# Patient Record
Sex: Female | Born: 1985 | Race: Black or African American | Hispanic: No | Marital: Married | Smoking: Never smoker
Health system: Southern US, Community
[De-identification: ages and names within clinical notes are randomized; demographics above are authoritative.]

## PROBLEM LIST (undated history)

## (undated) ENCOUNTER — Inpatient Hospital Stay (HOSPITAL_COMMUNITY): Payer: BLUE CROSS/BLUE SHIELD

## (undated) DIAGNOSIS — M359 Systemic involvement of connective tissue, unspecified: Secondary | ICD-10-CM

## (undated) HISTORY — PX: APPENDECTOMY: SHX54

---

## 2006-10-13 ENCOUNTER — Emergency Department: Payer: Self-pay | Admitting: Emergency Medicine

## 2006-10-27 HISTORY — PX: WISDOM TOOTH EXTRACTION: SHX21

## 2007-07-31 ENCOUNTER — Inpatient Hospital Stay (HOSPITAL_COMMUNITY): Admission: EM | Admit: 2007-07-31 | Discharge: 2007-08-01 | Payer: Self-pay | Admitting: Emergency Medicine

## 2007-07-31 ENCOUNTER — Encounter (INDEPENDENT_AMBULATORY_CARE_PROVIDER_SITE_OTHER): Payer: Self-pay | Admitting: General Surgery

## 2007-11-01 ENCOUNTER — Other Ambulatory Visit: Admission: RE | Admit: 2007-11-01 | Discharge: 2007-11-01 | Payer: Self-pay | Admitting: Family Medicine

## 2008-04-10 IMAGING — CT CT PELVIS W/ CM
2 of 5 series · 17 of 46 positions shown, 19 images · IV contrast (OMNI 300/WATER & 100 ML OMNI 300)
Comparison: There is no prior study for comparison.

CLINICAL DATA: Possible appendicitis. 
ABDOMEN CT WITH CONTRAST:
TECHNIQUE: Multidetector CT imaging of the abdomen was performed following the standard protocol during bolus administration of intravenous contrast.
Contrast:  100 cc Omnipaque 300
TECHNIQUE: Multidetector CT imaging of the pelvis was performed following the standard protocol during bolus administration of intravenous contrast.

[Series 2: routine abdomen · axial · 0.76mm/px · z∈[-388,-23]mm · 14 of 82 slices shown, 16 images]
[im 5/82  soft-tissue]
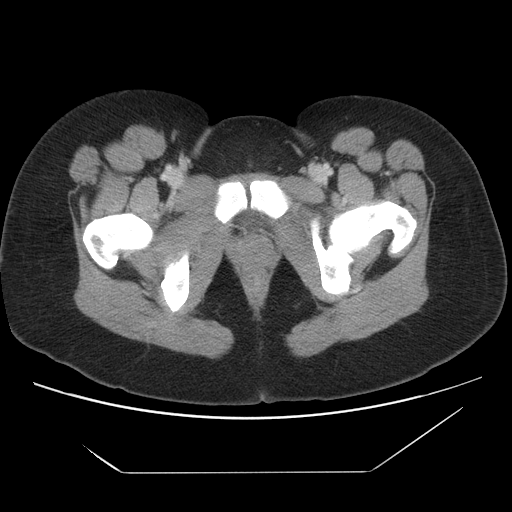
[im 5/82  bone]
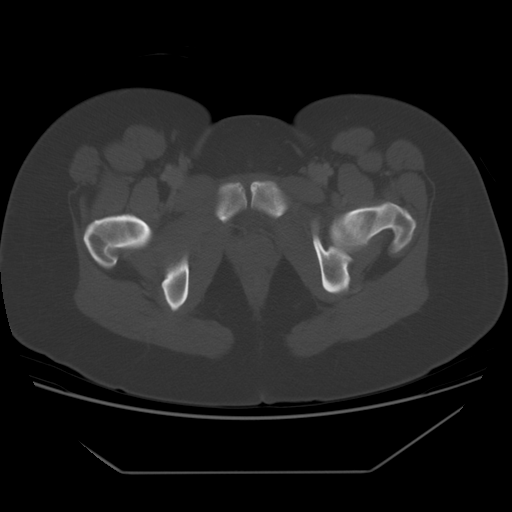
[im 9/82  soft-tissue]
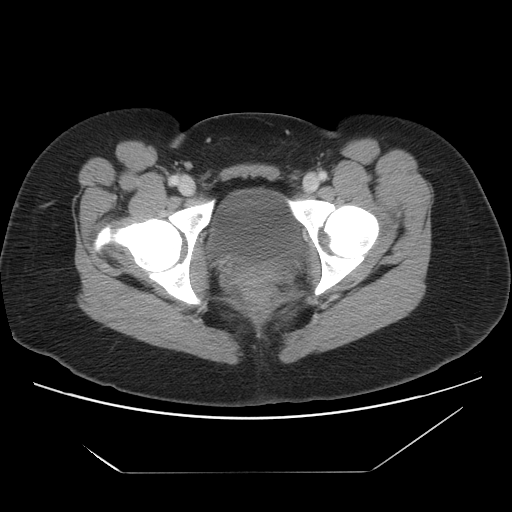
[im 18/82  soft-tissue]
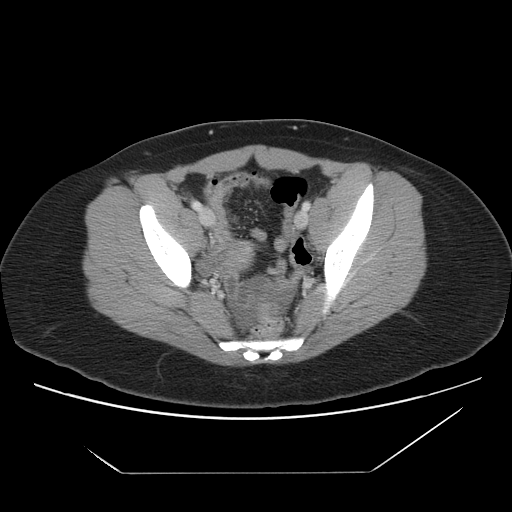
[im 22/82  soft-tissue]
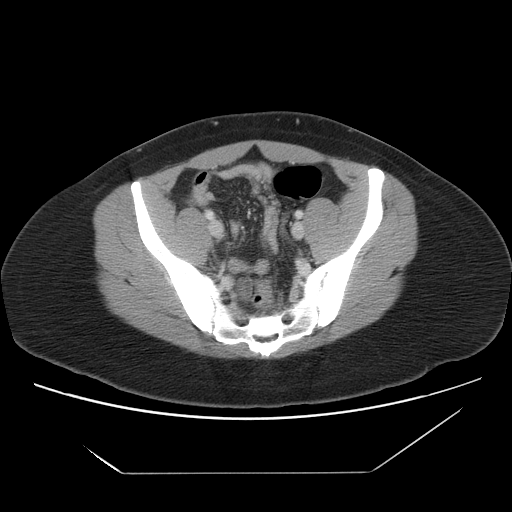
[im 26/82  soft-tissue]
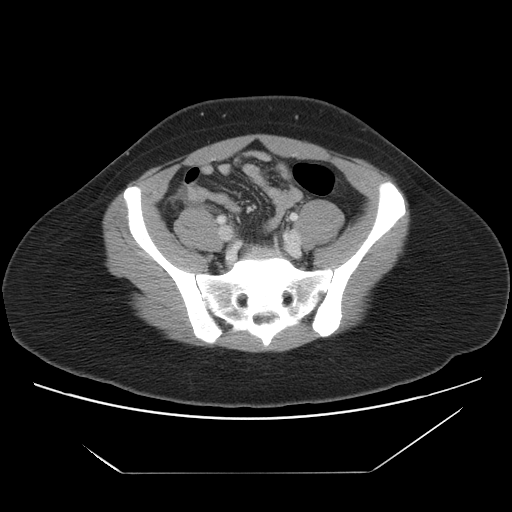
[im 35/82  soft-tissue]
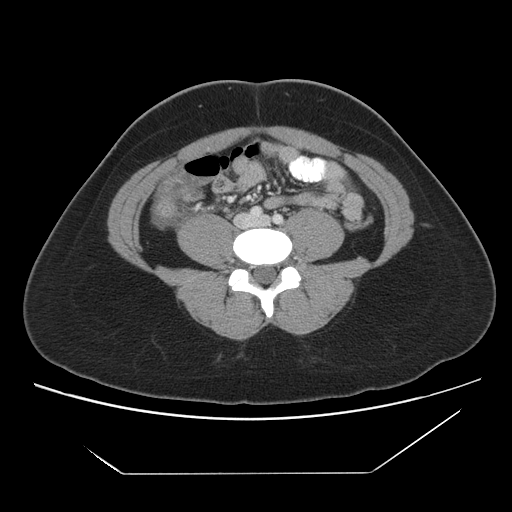
[im 39/82  soft-tissue]
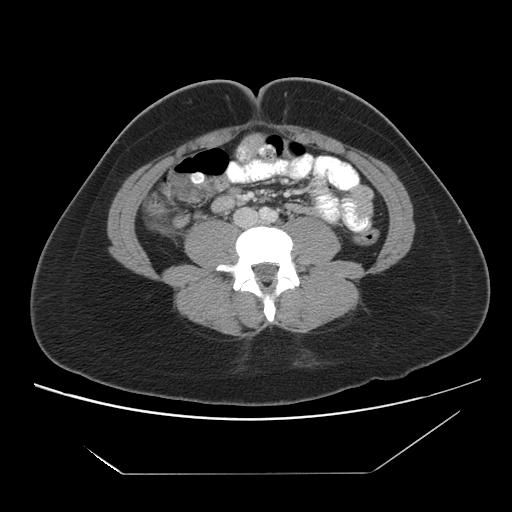
[im 43/82  soft-tissue]
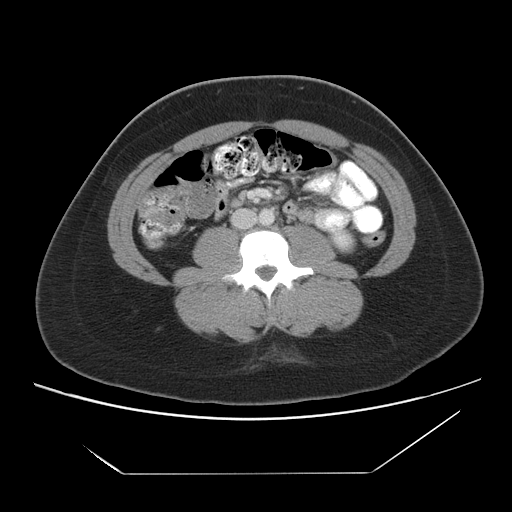
[im 47/82  soft-tissue]
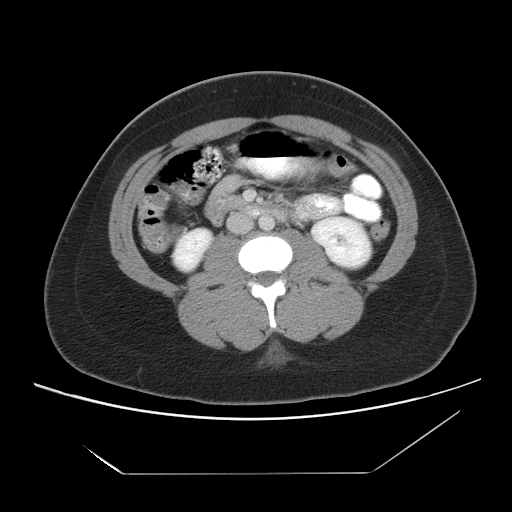
[im 47/82  bone]
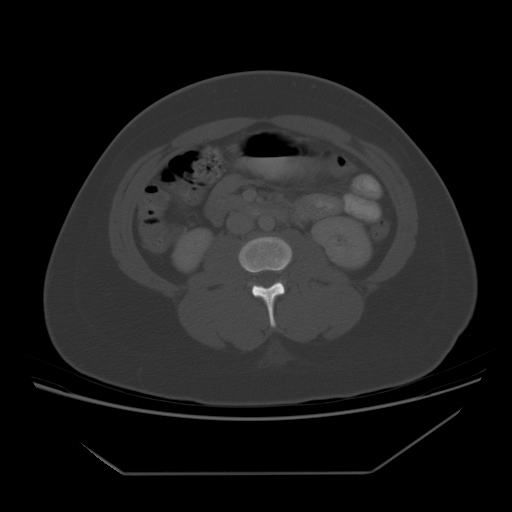
[im 56/82  soft-tissue]
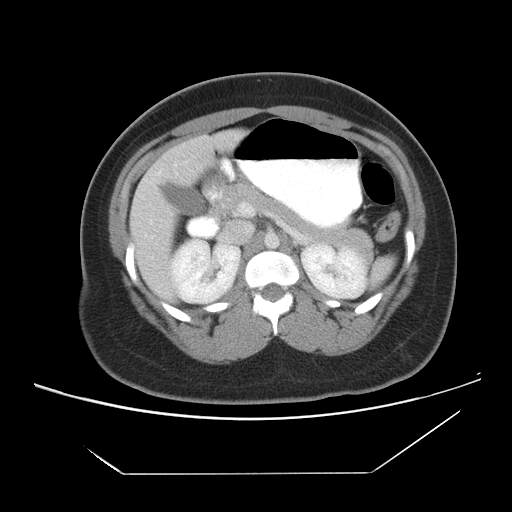
[im 60/82  soft-tissue]
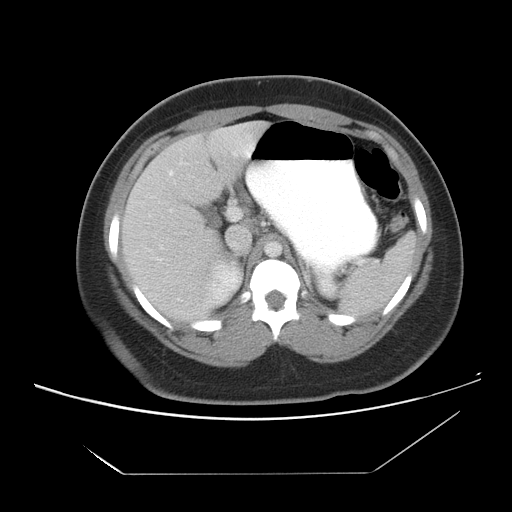
[im 64/82  soft-tissue]
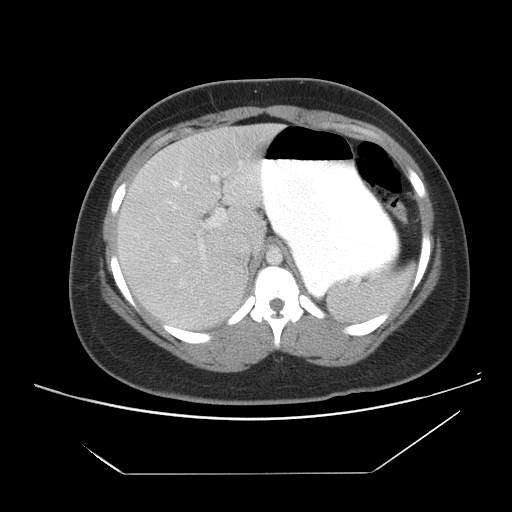
[im 73/82  soft-tissue]
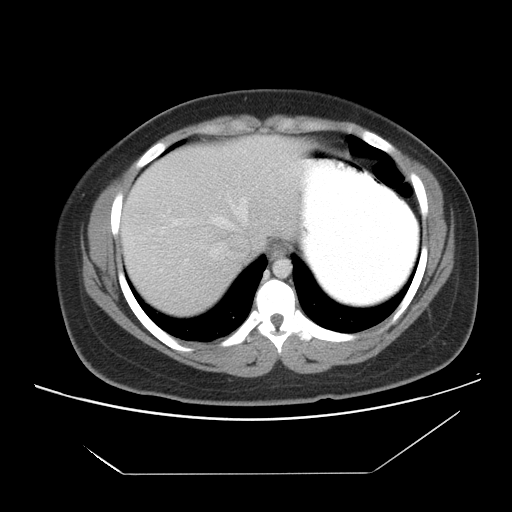
[im 77/82  soft-tissue]
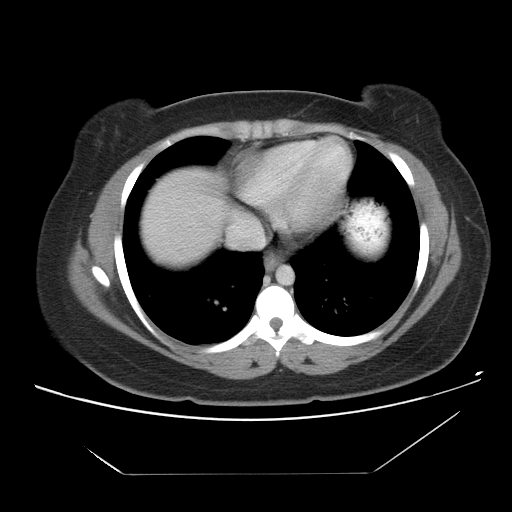

[Series 400: coronals · coronal · 0.88mm/px · 3 of 64 slices shown]
[im 22/64  soft-tissue]
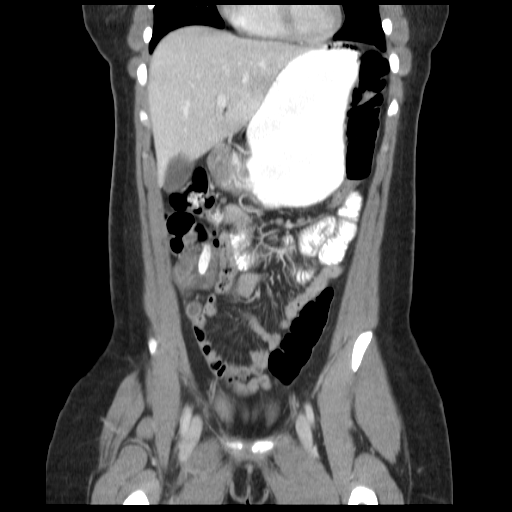
[im 29/64  soft-tissue]
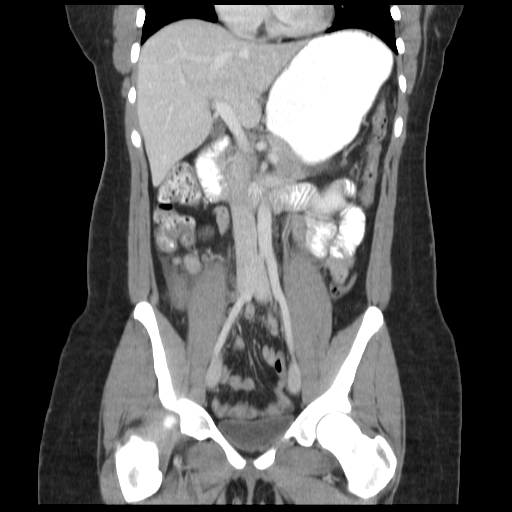
[im 36/64  soft-tissue]
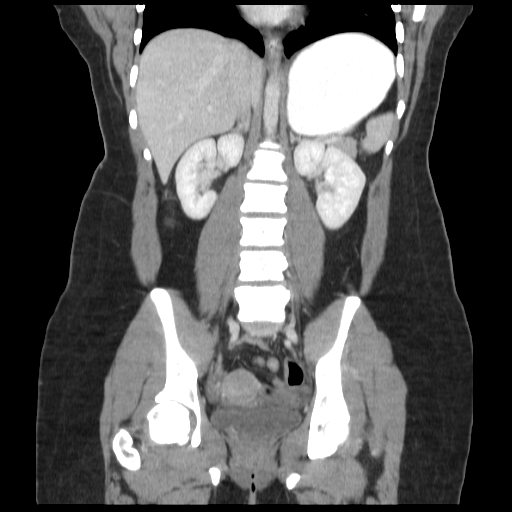

[17 of 46 positions shown; findings below may reference images not displayed]

FINDINGS: The visualized lung bases are unremarkable. 
The enhanced liver, spleen, pancreas, and adrenals are unremarkable.  There is no bowel obstruction.  There is no ascites or free air. No adenopathy is noted. The kidneys show symmetrical size and enhancement without evidence of focal mass. There is no hydronephrosis.
IMPRESSION: 1.  No bowel obstruction.  No ascites or free air.
2.  No adenopathy.
PELVIS CT WITH CONTRAST:
FINDINGS: There is markedly inflamed appendix with marked thickening and enhancement of the wall surrounded by fluid and stranding of surrounding fat.  The appendix measures 1 cm in diameter.  The findings are consistent with acute appendicitis. 
Small amount of free fluid is noted within posterior cul-de-sac. The enhanced uterus and adnexa are unremarkable. The urinary bladder is unremarkable. No destructive bony lesions are seen.
IMPRESSION: 1.  Acute appendicitis. 
2.  Small amount of pelvic free fluid noted.

## 2008-11-22 ENCOUNTER — Other Ambulatory Visit: Admission: RE | Admit: 2008-11-22 | Discharge: 2008-11-22 | Payer: Self-pay | Admitting: Family Medicine

## 2009-11-22 ENCOUNTER — Other Ambulatory Visit: Admission: RE | Admit: 2009-11-22 | Discharge: 2009-11-22 | Payer: Self-pay | Admitting: Family Medicine

## 2010-11-25 ENCOUNTER — Other Ambulatory Visit (HOSPITAL_COMMUNITY)
Admission: RE | Admit: 2010-11-25 | Discharge: 2010-11-25 | Disposition: A | Discharge status: Home / Self Care | Payer: BC Managed Care – PPO | Source: Ambulatory Visit | Attending: Family Medicine | Admitting: Family Medicine

## 2010-11-25 ENCOUNTER — Other Ambulatory Visit: Payer: Self-pay | Admitting: Family Medicine

## 2010-11-25 DIAGNOSIS — Z124 Encounter for screening for malignant neoplasm of cervix: Secondary | ICD-10-CM | POA: Insufficient documentation

## 2010-11-25 DIAGNOSIS — Z113 Encounter for screening for infections with a predominantly sexual mode of transmission: Secondary | ICD-10-CM | POA: Insufficient documentation

## 2011-03-11 NOTE — Op Note (Signed)
Marissa Moore, Marissa Moore              ACCOUNT NO.:  [id number]   MEDICAL RECORD NO.:  [id number]          PATIENT TYPE:  INP   LOCATION:  5703                         FACILITY:  [facility]   PHYSICIAN:  Gabrielle Dare. Janee Morn, M.D.DATE OF BIRTH:  1986/05/20   DATE OF PROCEDURE:  07/31/2007  DATE OF DISCHARGE:  08/01/2007                               OPERATIVE REPORT   PREOPERATIVE DIAGNOSIS:  Acute appendicitis.   POSTOPERATIVE DIAGNOSIS:  Acute appendicitis.   PROCEDURE:  Laparoscopic appendectomy.   SURGEON:  Violeta Gelinas, MD   ANESTHESIA:  General.   HISTORY OF PRESENT ILLNESS:  [name] is a 25 year old African  American female who presented to the emergency department with right  lower quadrant pain.  Workup including CT scan of the abdomen and pelvis  is consistent with acute appendicitis.  She is brought emergently to the  operating room.   PROCEDURE IN DETAIL:  Informed consent was obtained.  The patient  received intravenous antibiotics.  She was identified in the preop  holding area.  She was brought to the operating room and general  anesthesia was administered by the anesthesia staff.  Her abdomen was  prepped and draped in a sterile fashion.  The infraumbilical region was  infiltrated with 0.25% Marcaine with epinephrine.  An infraumbilical  incision was made.  Subcutaneous tissues were dissected down, revealing  the anterior fascia; this was divided sharply and the peritoneal cavity  was entered under direct vision.  A 0 Vicryl pursestring suture was  placed around the fascial opening and the Hasson trocar was inserted  into the abdomen.  The abdomen was insufflated with carbon dioxide in  the standard fashion.  Under direct vision, then a 12-mm left lower  quadrant and a 5-mm right upper quadrant port were placed.  Marcaine  0.25% with epinephrine was used at all port sites.  Laparoscopic  exploration revealed a very inflamed appendix.  It had some attachments  along  the lateral peritoneal reflection and these were taken down,  staying very superficial just behind the peritoneum; it was also  somewhat stuck to the terminal ileum.  This was gently swept away  without any damage to the bowel.  The mesoappendix was then dissected.  The mesoappendix was divided with endoscopic GIA stapler with a total of  2 firings.  The base of the appendix was viable.  The appendix was not  perforated.  The base the appendix was then divided with an endoscopic  GIA stapler with a vascular load.  Staple line was intact and there was  no bleeding.  The appendix was placed in the EndoCatch bag and removed  from the abdomen via the left lower quadrant port site.  The abdomen was  copiously irrigated with 2 L of saline.  Irrigation fluids all returned  clear.  Staple lines were rechecked and remained dry and intact.  The  remainder of the irrigation fluid was evacuated.  Staple lines were  rechecked; excellent hemostasis was ensured.  The ports were then  removed under direct vision.  Pneumoperitoneum was released.  The  infraumbilical fascia was closed  by tying the 0 Vicryl pursestring  suture with care not to trap any intra-abdominal contents.  All 3 wounds  were copiously irrigated and the skin of each was  closed with a running 4-0 Vicryl subcuticular stitch.  Sponge, needle  and instrument counts were correct.  Benzoin, Steri-Strips and sterile  dressings were applied.  The patient tolerated the procedure well  without apparent complication and was taken to the recovery in stable  condition.      Gabrielle Dare Janee Morn, M.D.  Electronically Signed     BET/MEDQ  D:  07/31/2007  T:  08/02/2007  Job:  [id number]

## 2011-03-11 NOTE — H&P (Signed)
[name], Moore              ACCOUNT NO.:  [id number]   MEDICAL RECORD NO.:  [id number]          PATIENT TYPE:  INP   LOCATION:  5703                         FACILITY:  [facility]   PHYSICIAN:  Gabrielle Dare. Janee Morn, M.D.DATE OF BIRTH:  10/12/1986   DATE OF ADMISSION:  07/31/2007  DATE OF DISCHARGE:                              HISTORY & PHYSICAL   CHIEF COMPLAINT:  Right lower quadrant abdominal pain.   HISTORY OF PRESENT ILLNESS:  Marissa Moore is a 25 year old African  American female who developed midabdominal pain yesterday afternoon.  She had a similar episode about 1 week ago that went away spontaneously.  This time, however, the pain persisted.  It was associated with nausea  and vomiting x2.  The pain has gradually moved to the right lower  quadrant over the past 24 hours.  She went to an Urgent Care earlier  today.  They suspected appendicitis, and she was sent to [facility]  Emergency Department for further evaluation here.  She was found to have  a white blood cell count of 19.5.  CT scan of the abdomen and pelvis was  done demonstrating acute appendicitis.  Emergency department physician  also did pelvic exam which was unremarkable.  Urine pregnancy is  negative.  She continues to have pain.   PAST MEDICAL HISTORY:  Negative.   PAST SURGICAL HISTORY:  None.   SOCIAL HISTORY:  She does not smoke.  She does not drink alcohol.  She  works as a Theatre manager.   REVIEW OF SYSTEMS:  Significant in the GI system as above.  For  pulmonary, she has had a mild cough recently.  The remainder of her  review of systems is negative.   PHYSICAL EXAMINATION:  VITAL SIGNS:  Temperature 98.8, pulse 85,  respirations 22, blood pressure 112/76.  GENERAL:  She is awake and alert.  She appears her stated age.  She is  in no distress.  HEENT:  Pupils are equal.  Sclerae are clear.  Oral mucosa is moist.  NECK:  Exam has no tenderness.  No masses are noted.  LUNGS:  Are clear to  auscultation.  No wheezing is heard.  Respiratory  effort is good.  HEART:  Regular.  No murmurs are heard.  Pulse is palpable in the left  chest.  ABDOMEN:  Bowel sounds are present.  She has tenderness in the right  lower quadrant with voluntary guarding.  There is no tenderness  elsewhere.  No masses are noted.  No organomegaly is felt.  SKIN:  Exam demonstrates some scattered, hypopigmented areas, mostly  involving her trunk.  PELVIC:  Exam was done by emergency department physician as described  above.  NEUROLOGIC:  Exam has no focal deficit noted.  She is awake, alert and  oriented.   LABORATORY STUDIES:  White blood cell count 19.5, hemoglobin 14.9,  sodium 134, potassium 2.9, chloride 97, CO2 28, BUN 6, creatinine 0.95,  glucose 80.  Urine pregnancy negative.  LFTs are normal.  Lipase is 12.   IMPRESSION:  1. Acute appendicitis.  2. Hypokalemia.   PLAN:  1.  We will take her to the operating room for laparoscopic      appendectomy.  We will give intravenous antibiotics at this time.      Procedure risks and benefits were discussed in detail with the      patient, and she is agreeable.  2. In regards to her hypokalemia, this will be replaced intravenously.      We are starting that immediately in the emergency department.      Gabrielle Dare Janee Morn, M.D.  Electronically Signed     BET/MEDQ  D:  07/31/2007  T:  08/01/2007  Job:  [id number]

## 2011-03-11 NOTE — Discharge Summary (Signed)
NAMELAURY, HUIZAR              ACCOUNT NO.:  [id number]   MEDICAL RECORD NO.:  [id number]          PATIENT TYPE:  INP   LOCATION:  5703                         FACILITY:  [facility]   PHYSICIAN:  Gabrielle Dare. Janee Morn, M.D.DATE OF BIRTH:  05/06/1986   DATE OF ADMISSION:  07/31/2007  DATE OF DISCHARGE:  08/01/2007                               DISCHARGE SUMMARY   DISCHARGE DIAGNOSES:  1. Acute appendicitis.  2. Status post laparoscopic appendectomy.   HISTORY OF PRESENT ILLNESS:  Ms. Hyacinth Meeker is a 25 year old, African-  American female who presented to the emergency department with right  lower quadrant pain yesterday.  Workup demonstrated acute appendicitis.   HOSPITAL COURSE:  The patient underwent uncomplicated laparoscopic  appendectomy.  Her appendix was not perforated.  Postoperatively, she  remained afebrile and hemodynamically stable.  She tolerated gradual  advancement of her diet.  She had good pain control and she is  discharged home on postop day #1, in stable condition.   DISCHARGE DIET:  Regular.   DISCHARGE ACTIVITY:  No lifting.   DISCHARGE MEDICATIONS:  1. Percocet 5/325 one to two every 6 hours as needed for pain.  2. Continue oral birth control pill which is Ortho-Novum daily.   FOLLOW UP:  Follow up with myself in 3 weeks.      Gabrielle Dare Janee Morn, M.D.  Electronically Signed     BET/MEDQ  D:  08/01/2007  T:  08/02/2007  Job:  [id number]

## 2011-08-07 LAB — CBC
MCV: 85.5
Platelets: 340
RBC: 5.08
WBC: 19.5 — ABNORMAL HIGH

## 2011-08-07 LAB — URINALYSIS, ROUTINE W REFLEX MICROSCOPIC
Bilirubin Urine: NEGATIVE
Nitrite: NEGATIVE
Urobilinogen, UA: 1

## 2011-08-07 LAB — DIFFERENTIAL
Eosinophils Absolute: 0
Lymphocytes Relative: 7 — ABNORMAL LOW
Lymphs Abs: 1.3
Monocytes Absolute: 0.7
Neutro Abs: 17.4 — ABNORMAL HIGH

## 2011-08-07 LAB — COMPREHENSIVE METABOLIC PANEL
Alkaline Phosphatase: 63
GFR calc Af Amer: >60
GFR calc non Af Amer: >60
Glucose, Bld: 80
Sodium: 134 — ABNORMAL LOW
Total Bilirubin: 0.9
Total Protein: 8.3

## 2011-08-07 LAB — URINE MICROSCOPIC-ADD ON

## 2011-08-07 LAB — RPR: RPR Ser Ql: NONREACTIVE

## 2011-08-07 LAB — POCT PREGNANCY, URINE: Operator id: 26520

## 2011-11-27 ENCOUNTER — Other Ambulatory Visit: Payer: Self-pay | Admitting: Family Medicine

## 2011-11-27 ENCOUNTER — Other Ambulatory Visit (HOSPITAL_COMMUNITY)
Admission: RE | Admit: 2011-11-27 | Discharge: 2011-11-27 | Disposition: A | Discharge status: Home / Self Care | Payer: BC Managed Care – PPO | Source: Ambulatory Visit | Attending: Family Medicine | Admitting: Family Medicine

## 2011-11-27 DIAGNOSIS — Z113 Encounter for screening for infections with a predominantly sexual mode of transmission: Secondary | ICD-10-CM | POA: Insufficient documentation

## 2011-11-27 DIAGNOSIS — Z124 Encounter for screening for malignant neoplasm of cervix: Secondary | ICD-10-CM | POA: Insufficient documentation

## 2013-01-10 ENCOUNTER — Other Ambulatory Visit (HOSPITAL_COMMUNITY)
Admission: RE | Admit: 2013-01-10 | Discharge: 2013-01-10 | Disposition: A | Discharge status: Home / Self Care | Payer: BC Managed Care – PPO | Source: Ambulatory Visit | Attending: Family Medicine | Admitting: Family Medicine

## 2013-01-10 ENCOUNTER — Other Ambulatory Visit: Payer: Self-pay | Admitting: Family Medicine

## 2013-01-10 DIAGNOSIS — Z124 Encounter for screening for malignant neoplasm of cervix: Secondary | ICD-10-CM | POA: Insufficient documentation

## 2014-01-11 ENCOUNTER — Other Ambulatory Visit (HOSPITAL_COMMUNITY)
Admission: RE | Admit: 2014-01-11 | Discharge: 2014-01-11 | Disposition: A | Discharge status: Home / Self Care | Payer: BC Managed Care – PPO | Source: Ambulatory Visit | Attending: Family Medicine | Admitting: Family Medicine

## 2014-01-11 DIAGNOSIS — Z124 Encounter for screening for malignant neoplasm of cervix: Secondary | ICD-10-CM | POA: Insufficient documentation

## 2014-01-11 DIAGNOSIS — Z113 Encounter for screening for infections with a predominantly sexual mode of transmission: Secondary | ICD-10-CM | POA: Insufficient documentation

## 2014-01-11 DIAGNOSIS — Z1151 Encounter for screening for human papillomavirus (HPV): Secondary | ICD-10-CM | POA: Insufficient documentation

## 2014-08-14 ENCOUNTER — Other Ambulatory Visit (HOSPITAL_COMMUNITY)
Admission: RE | Admit: 2014-08-14 | Discharge: 2014-08-14 | Disposition: A | Discharge status: Home / Self Care | Payer: BC Managed Care – PPO | Source: Ambulatory Visit | Attending: Family Medicine | Admitting: Family Medicine

## 2014-08-14 ENCOUNTER — Other Ambulatory Visit: Payer: Self-pay | Admitting: Family Medicine

## 2014-08-14 DIAGNOSIS — Z113 Encounter for screening for infections with a predominantly sexual mode of transmission: Secondary | ICD-10-CM | POA: Insufficient documentation

## 2014-08-14 DIAGNOSIS — R87612 Low grade squamous intraepithelial lesion on cytologic smear of cervix (LGSIL): Secondary | ICD-10-CM | POA: Diagnosis not present

## 2014-08-15 LAB — CYTOLOGY - PAP

## 2015-01-17 ENCOUNTER — Other Ambulatory Visit (HOSPITAL_COMMUNITY)
Admission: RE | Admit: 2015-01-17 | Discharge: 2015-01-17 | Disposition: A | Discharge status: Home / Self Care | Payer: BLUE CROSS/BLUE SHIELD | Source: Ambulatory Visit | Attending: Family Medicine | Admitting: Family Medicine

## 2015-01-17 ENCOUNTER — Other Ambulatory Visit: Payer: Self-pay | Admitting: Family Medicine

## 2015-01-17 DIAGNOSIS — Z01419 Encounter for gynecological examination (general) (routine) without abnormal findings: Secondary | ICD-10-CM | POA: Diagnosis not present

## 2015-01-17 DIAGNOSIS — Z1151 Encounter for screening for human papillomavirus (HPV): Secondary | ICD-10-CM | POA: Insufficient documentation

## 2015-01-19 LAB — CYTOLOGY - PAP

## 2015-06-20 LAB — OB RESULTS CONSOLE HEPATITIS B SURFACE ANTIGEN: Hepatitis B Surface Ag: NEGATIVE

## 2015-06-20 LAB — OB RESULTS CONSOLE HIV ANTIBODY (ROUTINE TESTING): HIV: NONREACTIVE

## 2015-06-20 LAB — OB RESULTS CONSOLE ABO/RH: RH Type: POSITIVE

## 2015-06-20 LAB — OB RESULTS CONSOLE RPR: RPR: NONREACTIVE

## 2015-06-20 LAB — OB RESULTS CONSOLE RUBELLA ANTIBODY, IGM: Rubella: IMMUNE

## 2015-07-24 ENCOUNTER — Encounter (HOSPITAL_COMMUNITY): Payer: Self-pay

## 2015-07-24 ENCOUNTER — Ambulatory Visit (HOSPITAL_COMMUNITY): Payer: BLUE CROSS/BLUE SHIELD

## 2015-07-24 ENCOUNTER — Ambulatory Visit (HOSPITAL_COMMUNITY)
Admission: RE | Admit: 2015-07-24 | Discharge: 2015-07-24 | Disposition: A | Discharge status: Home / Self Care | Payer: BLUE CROSS/BLUE SHIELD | Source: Ambulatory Visit | Attending: Obstetrics and Gynecology | Admitting: Obstetrics and Gynecology

## 2015-07-24 VITALS — BP 130/84 | HR 93 | Wt 147.6 lb

## 2015-07-24 DIAGNOSIS — M359 Systemic involvement of connective tissue, unspecified: Secondary | ICD-10-CM | POA: Diagnosis not present

## 2015-07-24 DIAGNOSIS — Z3A09 9 weeks gestation of pregnancy: Secondary | ICD-10-CM | POA: Insufficient documentation

## 2015-07-24 DIAGNOSIS — O26891 Other specified pregnancy related conditions, first trimester: Secondary | ICD-10-CM | POA: Diagnosis not present

## 2015-07-24 DIAGNOSIS — Q799 Congenital malformation of musculoskeletal system, unspecified: Secondary | ICD-10-CM

## 2015-07-24 NOTE — Consult Note (Signed)
MATERNAL FETAL MEDICINE CONSULT  Patient Name: Marissa Moore Medical Record Number:  [id number] Date of Birth: Feb 08, 1986 Requesting Physician Name:  Dr. Richardson Dopp Date of Service: 07/24/2015  Chief Complaint Unspecified Connective Tissue disorder affecting pregnancy   History of Present Illness Marissa Moore is a 29 y.o. AA female G1 at [redacted]w[redacted]d, presenting in consultation for unspecified rheumatologic condition and high risk medication use.  She denies any vaginal bleeding, leakage of fluid, or contractions.  She has no other complaints today.    Issues Complicating this Pregnancy 1. 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  Patient History OB History  Gravida Para Term Preterm AB SAB TAB Ectopic Multiple Living  1             # Outcome Date GA Lbr Len/2nd Weight Sex Delivery Anes PTL Lv  1 Current               No past medical history on file.  No past surgical history on file.  Social History   Social History  . Marital Status: Unknown    Spouse Name: N/A  . Number of Children: N/A  . Years of Education: N/A   Social History Main Topics  . Smoking status: Not on file  . Smokeless tobacco: Not on file  . Alcohol Use: Not on file  . Drug Use: Not on file  . Sexual Activity: Not on file   Other Topics Concern  . Not on file   Social History Narrative  . No narrative on file    No family history on file. In addition, the patient has no family history of mental retardation, birth defects, or genetic diseases.  Physical Examination Filed Vitals:   07/24/15 1511  BP: 130/84  Pulse: 93    Patient Active Problem  List   Diagnosis Date Noted  . 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 [redacted] weeks gestation of pregnancy 07/24/2015    Assessment and Recommendations Marissa Moore and I spoke generally concerning her autoimmune condition that presented initially as very SLE-like, if you will.  Generally speaking iIf patients become pregnant when disease is not active, outcomes are usually favorable - According to available data, pregnancy probably does not precipitate autoimmune and lupus-like  Flares; however, postpartum flares are common. - A lupus-like flare during pregnancy may be mistaken for pre-eclampsia. Thrombocytopenia, hypertension, and proteinuria occur in both SLE and lupus nephritis.The two conditions may be distinguished by complement levels (low in lupus flare) and anti-DSDNA levels (high in lupus flare). We will obtain baseline pre-eclampisa labs today. - Most common pregnancy issues include fetal loss, neonatal lupus with or without heart block, and organ system flare. - Anti-SSA and anti-SSB antibodies may cause fetal heart block. If these values return positive, patient will need serial fetal echos to assess for heart block, which is treated with steroids, plasmapharesis, and a pacemaker. - Plaquenil is pregnancy category C. It is is one of the medications recommended for the management of lupus and lupus nephritis in pregnant women. Its use in pregnancy is generally considered safe and is Marissa Moore case the benefits of continued symptomatic control outweigh the potential fetal risks, especially at this gestational age.  Maternal use of hydroxychloroquine may also decrease the incidence of cardiac malformations associated with neonatal lupus. - Prolonged NSAID use is not advised due to increased risk of oligohydramnios, NEC, and premature closure of the ductus arteriosus. - Cyclophosphamide, methotrexate, and leflunomide are contraindicated in pregnancy  Plan: - Rheum follow-up  prn - Baseline pre-eclampsia labs - SSA and SSb antibodies - Baseline complement levels - Serial growth [place] in third trimester - Delivery no earlier than 39 weeks unless otherwise indicated   I spent 40 minutes with Marissa Moore today, 50% of which was spent in face-to-face counseling.  Joneen Boers, MD Maternal-Fetal Medicine Fellow

## 2015-07-27 ENCOUNTER — Other Ambulatory Visit (HOSPITAL_COMMUNITY): Payer: Self-pay | Admitting: Obstetrics and Gynecology

## 2015-11-11 ENCOUNTER — Inpatient Hospital Stay (HOSPITAL_COMMUNITY)
Admission: AD | Admit: 2015-11-11 | Discharge: 2015-11-17 | DRG: 765 | Disposition: A | Discharge status: Home / Self Care | Payer: BLUE CROSS/BLUE SHIELD | Source: Ambulatory Visit | Attending: Obstetrics and Gynecology | Admitting: Obstetrics and Gynecology

## 2015-11-11 ENCOUNTER — Encounter (HOSPITAL_COMMUNITY): Payer: Self-pay | Admitting: *Deleted

## 2015-11-11 DIAGNOSIS — O2312 Infections of bladder in pregnancy, second trimester: Secondary | ICD-10-CM | POA: Diagnosis not present

## 2015-11-11 DIAGNOSIS — Z3A25 25 weeks gestation of pregnancy: Secondary | ICD-10-CM | POA: Diagnosis not present

## 2015-11-11 DIAGNOSIS — N390 Urinary tract infection, site not specified: Secondary | ICD-10-CM | POA: Diagnosis present

## 2015-11-11 DIAGNOSIS — O1492 Unspecified pre-eclampsia, second trimester: Secondary | ICD-10-CM

## 2015-11-11 DIAGNOSIS — N3000 Acute cystitis without hematuria: Secondary | ICD-10-CM

## 2015-11-11 DIAGNOSIS — Z1389 Encounter for screening for other disorder: Secondary | ICD-10-CM

## 2015-11-11 DIAGNOSIS — O1414 Severe pre-eclampsia complicating childbirth: Principal | ICD-10-CM | POA: Diagnosis present

## 2015-11-11 DIAGNOSIS — K219 Gastro-esophageal reflux disease without esophagitis: Secondary | ICD-10-CM | POA: Diagnosis present

## 2015-11-11 DIAGNOSIS — O1420 HELLP syndrome (HELLP), unspecified trimester: Secondary | ICD-10-CM | POA: Diagnosis present

## 2015-11-11 DIAGNOSIS — O9962 Diseases of the digestive system complicating childbirth: Secondary | ICD-10-CM | POA: Diagnosis present

## 2015-11-11 DIAGNOSIS — Z6832 Body mass index (BMI) 32.0-32.9, adult: Secondary | ICD-10-CM

## 2015-11-11 DIAGNOSIS — N3001 Acute cystitis with hematuria: Secondary | ICD-10-CM | POA: Diagnosis not present

## 2015-11-11 DIAGNOSIS — O1422 HELLP syndrome (HELLP), second trimester: Secondary | ICD-10-CM | POA: Diagnosis not present

## 2015-11-11 DIAGNOSIS — Z98891 History of uterine scar from previous surgery: Secondary | ICD-10-CM

## 2015-11-11 HISTORY — DX: Systemic involvement of connective tissue, unspecified: M35.9

## 2015-11-11 LAB — URINALYSIS, ROUTINE W REFLEX MICROSCOPIC
Glucose, UA: NEGATIVE mg/dL
KETONES UR: 15 mg/dL — AB
LEUKOCYTES UA: NEGATIVE
NITRITE: POSITIVE — AB
PH: 6.5 (ref 5.0–8.0)
Specific Gravity, Urine: >1.03 — ABNORMAL HIGH (ref 1.005–1.030)

## 2015-11-11 LAB — COMPREHENSIVE METABOLIC PANEL
ALK PHOS: 250 U/L — AB (ref 38–126)
ALT: 239 U/L — ABNORMAL HIGH (ref 14–54)
ANION GAP: 11 (ref 5–15)
AST: 217 U/L — ABNORMAL HIGH (ref 15–41)
Albumin: 3.3 g/dL — ABNORMAL LOW (ref 3.5–5.0)
BUN: 8 mg/dL (ref 6–20)
CO2: 24 mmol/L (ref 22–32)
CREATININE: 0.75 mg/dL (ref 0.44–1.00)
Calcium: 8.9 mg/dL (ref 8.9–10.3)
Chloride: 100 mmol/L — ABNORMAL LOW (ref 101–111)
GFR calc non Af Amer: >60 mL/min (ref >60)
GLUCOSE: 90 mg/dL (ref 65–99)
POTASSIUM: 3.4 mmol/L — AB (ref 3.5–5.1)
SODIUM: 135 mmol/L (ref 135–145)
TOTAL PROTEIN: 6.8 g/dL (ref 6.5–8.1)
Total Bilirubin: 1.5 mg/dL — ABNORMAL HIGH (ref 0.3–1.2)

## 2015-11-11 LAB — URINE MICROSCOPIC-ADD ON

## 2015-11-11 LAB — LACTATE DEHYDROGENASE: LDH: 597 U/L — AB (ref 98–192)

## 2015-11-11 LAB — CBC
HEMATOCRIT: 38.6 % (ref 36.0–46.0)
HEMOGLOBIN: 13.9 g/dL (ref 12.0–15.0)
MCH: 31.4 pg (ref 26.0–34.0)
MCHC: 36 g/dL (ref 30.0–36.0)
MCV: 87.1 fL (ref 78.0–100.0)
PLATELETS: 112 10*3/uL — AB (ref 150–400)
RBC: 4.43 MIL/uL (ref 3.87–5.11)
RDW: 14.6 % (ref 11.5–15.5)
WBC: 12 10*3/uL — AB (ref 4.0–10.5)

## 2015-11-11 LAB — PROTEIN / CREATININE RATIO, URINE
CREATININE, URINE: 357 mg/dL
Protein Creatinine Ratio: 2.13 mg/mg{Cre} — ABNORMAL HIGH (ref 0.00–0.15)
Total Protein, Urine: 759 mg/dL

## 2015-11-11 LAB — TYPE AND SCREEN
ABO/RH(D): A POS
ANTIBODY SCREEN: NEGATIVE

## 2015-11-11 LAB — URIC ACID: URIC ACID, SERUM: 5.5 mg/dL (ref 2.3–6.6)

## 2015-11-11 MED ORDER — MAGNESIUM SULFATE 50 % IJ SOLN
2.0000 g/h | INTRAVENOUS | Status: DC
  Administered 2015-11-11 – 2015-11-12 (×2): 2 g/h via INTRAVENOUS
  Filled 2015-11-11 (×2): qty 80

## 2015-11-11 MED ORDER — DOCUSATE SODIUM 100 MG PO CAPS
100.0000 mg | ORAL_CAPSULE | Freq: Every day | ORAL | Status: DC
  Administered 2015-11-12: 100 mg via ORAL
  Filled 2015-11-11 (×2): qty 1

## 2015-11-11 MED ORDER — CALCIUM CARBONATE ANTACID 500 MG PO CHEW
2.0000 | CHEWABLE_TABLET | ORAL | Status: DC | PRN
  Filled 2015-11-11: qty 2

## 2015-11-11 MED ORDER — MAGNESIUM SULFATE BOLUS VIA INFUSION
4.0000 g | Freq: Once | INTRAVENOUS | Status: AC
  Administered 2015-11-11: 4 g via INTRAVENOUS
  Filled 2015-11-11: qty 500

## 2015-11-11 MED ORDER — PANTOPRAZOLE SODIUM 40 MG PO TBEC
40.0000 mg | DELAYED_RELEASE_TABLET | Freq: Every day | ORAL | Status: DC | PRN
  Filled 2015-11-11: qty 1

## 2015-11-11 MED ORDER — HYDRALAZINE HCL 20 MG/ML IJ SOLN
10.0000 mg | Freq: Once | INTRAMUSCULAR | Status: AC | PRN
  Administered 2015-11-11: 10 mg via INTRAVENOUS
  Filled 2015-11-11: qty 1

## 2015-11-11 MED ORDER — BETAMETHASONE SOD PHOS & ACET 6 (3-3) MG/ML IJ SUSP
12.0000 mg | INTRAMUSCULAR | Status: AC
  Administered 2015-11-11 – 2015-11-12 (×2): 12 mg via INTRAMUSCULAR
  Filled 2015-11-11 (×2): qty 2

## 2015-11-11 MED ORDER — ZOLPIDEM TARTRATE 5 MG PO TABS: 5.0000 mg | ORAL_TABLET | Freq: Every evening | ORAL | Status: DC | PRN

## 2015-11-11 MED ORDER — SODIUM CHLORIDE 0.9 % IJ SOLN: 3.0000 mL | Freq: Two times a day (BID) | INTRAMUSCULAR | Status: DC

## 2015-11-11 MED ORDER — LABETALOL HCL 5 MG/ML IV SOLN
20.0000 mg | INTRAVENOUS | Status: AC | PRN
  Administered 2015-11-11: 20 mg via INTRAVENOUS
  Administered 2015-11-11: 80 mg via INTRAVENOUS
  Administered 2015-11-11: 40 mg via INTRAVENOUS
  Filled 2015-11-11: qty 4
  Filled 2015-11-11: qty 8
  Filled 2015-11-11: qty 16

## 2015-11-11 MED ORDER — HYDROXYCHLOROQUINE SULFATE 200 MG PO TABS
400.0000 mg | ORAL_TABLET | Freq: Every day | ORAL | Status: DC
  Administered 2015-11-12 – 2015-11-17 (×4): 400 mg via ORAL
  Filled 2015-11-11 (×7): qty 2

## 2015-11-11 MED ORDER — SODIUM CHLORIDE 0.9 % IJ SOLN: 3.0000 mL | INTRAMUSCULAR | Status: DC | PRN

## 2015-11-11 MED ORDER — SODIUM CHLORIDE 0.9 % IV SOLN: 250.0000 mL | INTRAVENOUS | Status: DC | PRN

## 2015-11-11 MED ORDER — PRENATAL MULTIVITAMIN CH
1.0000 | ORAL_TABLET | Freq: Every day | ORAL | Status: DC
  Filled 2015-11-11: qty 1

## 2015-11-11 MED ORDER — ACETAMINOPHEN 325 MG PO TABS: 650.0000 mg | ORAL_TABLET | ORAL | Status: DC | PRN

## 2015-11-11 MED ORDER — LACTATED RINGERS IV SOLN
INTRAVENOUS | Status: DC
  Administered 2015-11-11 – 2015-11-13 (×4): via INTRAVENOUS

## 2015-11-11 NOTE — MAU Note (Signed)
Pt reports right upper abd pain for 3 days, has history of acid reflux and it has been hurting also but is not hurting right now. States she has had nausea and vomiting also.

## 2015-11-11 NOTE — H&P (Signed)
HPI: 30 y/o G1P0 @ 107w4d estimated gestational age (by LMP confirmed by 8wk [place]) admitted for HELLP syndrome.  Pt initially presented due to worsening RUQ pain.  She also noted mild headache and spots earlier today that has now resolved. no Leaking of Fluid,   no Vaginal Bleeding,   no Uterine Contractions,  + Fetal Movement.   Pregnancy complicated by: 1) Connective tissue d/o- Plaquenil 200mg  two tablets daily 2) GERD- probiotic and Nexium daily   Prenatal Transfer Tool  Maternal Diabetes: No Genetic Screening: Normal Maternal Ultrasounds/Referrals: Normal Fetal Ultrasounds or other Referrals:  None Maternal Substance Abuse:  No Significant Maternal Medications:  Meds include: Other: Plaquenil Significant Maternal Lab Results: Lab values include: Other: see below   PNL:  GBS unknown, Rub Immune, Hep B neg, RPR NR, HIV neg, GC/C neg, glucola:not yet performed Blood type: A positive, antibody neg [place] @ 20wk: vertex/posterior/LV echogenic focus, no other abnormalities, female, EFW: 245g (42%)  OBHx: primip PMHx:  Connective tissue disorder Meds:  PNV, Plaquenil daily, Probiotic Allergy:   Allergies  Allergen Reactions  . Sulfa Antibiotics Hives   SurgHx: appendectomy SocHx:   no Tobacco, no  EtOH, no Illicit Drugs  O: BP 154/102 mmHg  Pulse 110  Temp(Src) 98.7 F (37.1 C) (Oral)  Resp 18  Ht 4\' 11"  (1.499 m)  Wt 71.215 kg (157 lb)  BMI 31.69 kg/m2  SpO2 100%  BP Range: 153-216/92-124  Gen. AAOx3, NAD CV.  RRR  No murmur.  Resp. CTAB, no wheeze or crackles. Abd. Gravid,  no tenderness,  no rigidity,  no guarding Extr.  no edema B/L , no calf tenderness, no Homan's B/L, brisk 2+ reflexes  FHT: 150 baseline, moderate variability, no accels,  no decels Toco: + irritability SVE: deferred  BSUS: VTX, posterior Placenta   Labs:  Results for orders placed or performed during the hospital encounter of 11/11/15 (from the past 24 hour(s))  Protein / creatinine ratio, urine      Status: Abnormal   Collection Time: 11/11/15  8:39 PM  Result Value Ref Range   Creatinine, Urine 357.00 mg/dL   Total Protein, Urine 759 mg/dL   Protein Creatinine Ratio 2.13 (H) 0.00 - 0.15 mg/mg[Cre]  Urinalysis, Routine w reflex microscopic (not at [facility])     Status: Abnormal   Collection Time: 11/11/15  8:39 PM  Result Value Ref Range   Color, Urine AMBER (A) YELLOW   APPearance CLEAR CLEAR   Specific Gravity, Urine >1.030 (H) 1.005 - 1.030   pH 6.5 5.0 - 8.0   Glucose, UA NEGATIVE NEGATIVE mg/dL   Hgb urine dipstick LARGE (A) NEGATIVE   Bilirubin Urine SMALL (A) NEGATIVE   Ketones, ur 15 (A) NEGATIVE mg/dL   Protein, ur >811 (A) NEGATIVE mg/dL   Nitrite POSITIVE (A) NEGATIVE   Leukocytes, UA NEGATIVE NEGATIVE  Urine microscopic-add on     Status: Abnormal   Collection Time: 11/11/15  8:39 PM  Result Value Ref Range   Squamous Epithelial / LPF 0-5 (A) NONE SEEN   WBC, UA 0-5 0 - 5 WBC/hpf   RBC / HPF 0-5 0 - 5 RBC/hpf   Bacteria, UA FEW (A) NONE SEEN   Crystals HIPPURIC ACID CRYSTALS (A) NEGATIVE   Urine-Other MUCOUS PRESENT   CBC     Status: Abnormal   Collection Time: 11/11/15  9:15 PM  Result Value Ref Range   WBC 12.0 (H) 4.0 - 10.5 K/uL   RBC 4.43 3.87 - 5.11 MIL/uL  Hemoglobin 13.9 12.0 - 15.0 g/dL   HCT 78.238.6 95.636.0 - 21.346.0 %   MCV 87.1 78.0 - 100.0 fL   MCH 31.4 26.0 - 34.0 pg   MCHC 36.0 30.0 - 36.0 g/dL   RDW 08.614.6 57.811.5 - 46.915.5 %   Platelets 112 (L) 150 - 400 K/uL  Comprehensive metabolic panel     Status: Abnormal   Collection Time: 11/11/15  9:15 PM  Result Value Ref Range   Sodium 135 135 - 145 mmol/L   Potassium 3.4 (L) 3.5 - 5.1 mmol/L   Chloride 100 (L) 101 - 111 mmol/L   CO2 24 22 - 32 mmol/L   Glucose, Bld 90 65 - 99 mg/dL   BUN 8 6 - 20 mg/dL   Creatinine, Ser 6.290.75 0.44 - 1.00 mg/dL   Calcium 8.9 8.9 - 52.810.3 mg/dL   Total Protein 6.8 6.5 - 8.1 g/dL   Albumin 3.3 (L) 3.5 - 5.0 g/dL   AST 413217 (H) 15 - 41 U/L   ALT 239 (H) 14 - 54 U/L    Alkaline Phosphatase 250 (H) 38 - 126 U/L   Total Bilirubin 1.5 (H) 0.3 - 1.2 mg/dL   GFR calc non Af Amer >60 >60 mL/min   GFR calc Af Amer >60 >60 mL/min   Anion gap 11 5 - 15  Lactate dehydrogenase     Status: Abnormal   Collection Time: 11/11/15  9:15 PM  Result Value Ref Range   LDH 597 (H) 98 - 192 U/L  Uric acid     Status: None   Collection Time: 11/11/15  9:15 PM  Result Value Ref Range   Uric Acid, Serum 5.5 2.3 - 6.6 mg/dL    A/P:  30 y.o. K4M0G1P0 @ 67105w4d EGA admitted for HELLP syndrome 1) HELLP syndrome -plan to start IV magnesium -BP to be controlled with IV anti-hypertensive protocol, currently s/p IV Labetalol x2 with improvement -plan for labs q 6-12hr, next set in am -Will start Betamethasone course -Brief discussion with Dr. Clarisa FlingBrost, plan for full MFM and NICU consult in am  2) FWB- reassuring for gestational age -EFM q shift -US in am for growth  3) Maternal well being -Connective tissue d/o- will continue Plaquenil -GERD- Maalox or Tums prn, may continue probiotic -Tylenol as needed for HA -PNV daily   Myna HidalgoJennifer Kaymarie Wynn, DO [phone] (pager) [phone] (office)

## 2015-11-11 NOTE — MAU Provider Note (Signed)
History     CSN: [id number]  Arrival date and time: 11/11/15 2039   First Provider Initiated Contact with Patient 11/11/15 2125         Chief Complaint  Patient presents with  . Abdominal Pain  . Nausea   HPI Marissa Moore is a 30 y.o. G1P0 at [redacted]w[redacted]d who presents for epigastric pain.  Reports right epigastric pain x 1 week that has gradually worsened. Associated with heartburn & n/v. Vomited 3 times today.  Reports headache earlier today as well as floaters in vision that have since resolved.  Denies chest pain or SOB.   OB History    Gravida Para Term Preterm AB TAB SAB Ectopic Multiple Living   1         0      Past Medical History  Diagnosis Date  . 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 Appendectomy    . Wisdom tooth extraction  2008    Family History  Problem Relation Age of Onset  . Hypertension Maternal Grandmother   . Depression Maternal Grandmother   . Hypertension Paternal Grandmother   . Depression Paternal Grandmother     Social History  Substance Use Topics  . Smoking status: Never Smoker   . Smokeless tobacco: Never Used  . Alcohol Use: No    Allergies:  Allergies  Allergen Reactions  . Sulfa Antibiotics Hives    No prescriptions prior to admission    Review of Systems  Constitutional: Negative.   Eyes: Positive for blurred vision.  Respiratory: Negative.   Cardiovascular: Negative.   Gastrointestinal: Positive for nausea and abdominal pain.  Genitourinary: Negative.  Negative for dysuria and flank pain.  Neurological: Positive for headaches.   Physical Exam   Blood pressure 191/100, pulse 89, temperature 98.7 F (37.1 C), temperature source Oral, resp. rate 18, height 4\' 11"  (1.499 m), weight 157 lb (71.215 kg), SpO2 99 %.  Patient Vitals for the past 24 hrs:  BP Temp Temp src Pulse Resp SpO2 Height Weight  11/11/15 2134 (!) 194/113 mmHg - - 92 - - - -  11/11/15 2133 - - -  94 - 100 % - -  11/11/15 2128 - - - 100 - 100 % - -  11/11/15 2125 191/100 mmHg - - 89 17 99 % - -  11/11/15 2113 (!) 208/117 mmHg - - 94 18 - - -  11/11/15 2103 (!) 216/111 mmHg - - 99 18 - - -  11/11/15 2055 (!) 211/124 mmHg 98.7 F (37.1 C) Oral 101 18 100 % 4\' 11"  (1.499 m) 157 lb (71.215 kg)     Physical Exam  Nursing note and vitals reviewed. Constitutional: She is oriented to person, place, and time. She appears well-developed and well-nourished. No distress.  HENT:  Head: Normocephalic and atraumatic.  Eyes: Conjunctivae are normal. Right eye exhibits no discharge. Left eye exhibits no discharge. No scleral icterus.  Neck: Normal range of motion.  Cardiovascular: Normal rate, regular rhythm and normal heart sounds.   No murmur heard. Respiratory: Effort normal and breath sounds normal. No respiratory distress. She has no wheezes.  GI: Soft. There is no tenderness.  Musculoskeletal: She exhibits no edema.  Neurological: She is alert and oriented to person, place, and time.  Reflex Scores:      Patellar reflexes are 3+ on the right side and 3+ on the left side. 1 beat clonus  Skin: Skin is warm  and dry. She is not diaphoretic.  Psychiatric: She has a normal mood and affect. Her behavior is normal. Judgment and thought content normal.   Fetal Tracing:  Baseline: 155 Variability: minimal to moderate Accelerations: none Decelerations: none  Toco: none   MAU Course  Procedures Results for orders placed or performed during the hospital encounter of 11/11/15 (from the past 24 hour(s))  Urinalysis, Routine w reflex microscopic (not [facility])     Status: Abnormal   Collection Time: 11/11/15  8:39 PM  Result Value Ref Range   Color, Urine AMBER (A) YELLOW   APPearance CLEAR CLEAR   Specific Gravity, Urine >1.030 (H) 1.005 - 1.030   pH 6.5 5.0 - 8.0   Glucose, UA NEGATIVE NEGATIVE mg/dL   Hgb urine dipstick LARGE (A) NEGATIVE   Bilirubin Urine SMALL (A) NEGATIVE    Ketones, ur 15 (A) NEGATIVE mg/dL   Protein, ur >161>300 (A) NEGATIVE mg/dL   Nitrite POSITIVE (A) NEGATIVE   Leukocytes, UA NEGATIVE NEGATIVE  Urine microscopic-add on     Status: Abnormal   Collection Time: 11/11/15  8:39 PM  Result Value Ref Range   Squamous Epithelial / LPF 0-5 (A) NONE SEEN   WBC, UA 0-5 0 - 5 WBC/hpf   RBC / HPF 0-5 0 - 5 RBC/hpf   Bacteria, UA FEW (A) NONE SEEN   Crystals HIPPURIC ACID CRYSTALS (A) NEGATIVE   Urine-Other MUCOUS PRESENT     MDM Labetalol protocol for severe range BPs 2133- Dr. Charlotta Newtonzan notified of pt's presentation & BPs. Is coming to see patient in MAU with plans for admission  Assessment and Plan  A:  1. Acute cystitis without hematuria   2. Preeclampsia, second trimester      Judeth HornErin Renisha Cockrum, NP  11/11/2015, 9:25 PM

## 2015-11-11 NOTE — MAU Note (Signed)
While obtaining VS pt also reports headache all day today. B/P elevated, pt taken to room 6 and provider notified.

## 2015-11-12 ENCOUNTER — Inpatient Hospital Stay (HOSPITAL_COMMUNITY): Payer: BLUE CROSS/BLUE SHIELD

## 2015-11-12 LAB — COMPREHENSIVE METABOLIC PANEL
ALK PHOS: 217 U/L — AB (ref 38–126)
ALT: 234 U/L — ABNORMAL HIGH (ref 14–54)
ALT: 223 U/L — AB (ref 14–54)
ALT: 215 U/L — ABNORMAL HIGH (ref 14–54)
ANION GAP: 8 (ref 5–15)
ANION GAP: 12 (ref 5–15)
AST: 197 U/L — ABNORMAL HIGH (ref 15–41)
AST: 171 U/L — AB (ref 15–41)
AST: 136 U/L — ABNORMAL HIGH (ref 15–41)
Albumin: 2.7 g/dL — ABNORMAL LOW (ref 3.5–5.0)
Albumin: 2.8 g/dL — ABNORMAL LOW (ref 3.5–5.0)
Albumin: 3.1 g/dL — ABNORMAL LOW (ref 3.5–5.0)
Alkaline Phosphatase: 229 U/L — ABNORMAL HIGH (ref 38–126)
Alkaline Phosphatase: 215 U/L — ABNORMAL HIGH (ref 38–126)
Anion gap: 11 (ref 5–15)
BILIRUBIN TOTAL: 0.8 mg/dL (ref 0.3–1.2)
BILIRUBIN TOTAL: 0.6 mg/dL (ref 0.3–1.2)
BUN: 7 mg/dL (ref 6–20)
BUN: 8 mg/dL (ref 6–20)
BUN: 8 mg/dL (ref 6–20)
CALCIUM: 7 mg/dL — AB (ref 8.9–10.3)
CHLORIDE: 102 mmol/L (ref 101–111)
CO2: 25 mmol/L (ref 22–32)
CO2: 21 mmol/L — AB (ref 22–32)
CO2: 22 mmol/L (ref 22–32)
CREATININE: 0.87 mg/dL (ref 0.44–1.00)
Calcium: 7.6 mg/dL — ABNORMAL LOW (ref 8.9–10.3)
Calcium: 7.2 mg/dL — ABNORMAL LOW (ref 8.9–10.3)
Chloride: 105 mmol/L (ref 101–111)
Chloride: 103 mmol/L (ref 101–111)
Creatinine, Ser: 0.76 mg/dL (ref 0.44–1.00)
Creatinine, Ser: 0.85 mg/dL (ref 0.44–1.00)
GFR calc Af Amer: >60 mL/min (ref >60)
GFR calc non Af Amer: >60 mL/min (ref >60)
GFR calc non Af Amer: >60 mL/min (ref >60)
Glucose, Bld: 121 mg/dL — ABNORMAL HIGH (ref 65–99)
Glucose, Bld: 220 mg/dL — ABNORMAL HIGH (ref 65–99)
Glucose, Bld: 128 mg/dL — ABNORMAL HIGH (ref 65–99)
POTASSIUM: 3.9 mmol/L (ref 3.5–5.1)
Potassium: 3.5 mmol/L (ref 3.5–5.1)
Potassium: 3.8 mmol/L (ref 3.5–5.1)
SODIUM: 134 mmol/L — AB (ref 135–145)
Sodium: 138 mmol/L (ref 135–145)
Sodium: 137 mmol/L (ref 135–145)
TOTAL PROTEIN: 6.2 g/dL — AB (ref 6.5–8.1)
TOTAL PROTEIN: 6.7 g/dL (ref 6.5–8.1)
Total Bilirubin: 0.8 mg/dL (ref 0.3–1.2)
Total Protein: 5.7 g/dL — ABNORMAL LOW (ref 6.5–8.1)

## 2015-11-12 LAB — CBC
HCT: 35.7 % — ABNORMAL LOW (ref 36.0–46.0)
HCT: 35.1 % — ABNORMAL LOW (ref 36.0–46.0)
HCT: 36.5 % (ref 36.0–46.0)
HEMOGLOBIN: 12.2 g/dL (ref 12.0–15.0)
Hemoglobin: 12.8 g/dL (ref 12.0–15.0)
Hemoglobin: 12.9 g/dL (ref 12.0–15.0)
MCH: 31.4 pg (ref 26.0–34.0)
MCH: 30.8 pg (ref 26.0–34.0)
MCH: 31.8 pg (ref 26.0–34.0)
MCHC: 35.9 g/dL (ref 30.0–36.0)
MCHC: 34.8 g/dL (ref 30.0–36.0)
MCHC: 35.3 g/dL (ref 30.0–36.0)
MCV: 87.5 fL (ref 78.0–100.0)
MCV: 88.6 fL (ref 78.0–100.0)
MCV: 89.9 fL (ref 78.0–100.0)
PLATELETS: 94 10*3/uL — AB (ref 150–400)
PLATELETS: 140 10*3/uL — AB (ref 150–400)
Platelets: 98 10*3/uL — ABNORMAL LOW (ref 150–400)
RBC: 4.08 MIL/uL (ref 3.87–5.11)
RBC: 3.96 MIL/uL (ref 3.87–5.11)
RBC: 4.06 MIL/uL (ref 3.87–5.11)
RDW: 14.8 % (ref 11.5–15.5)
RDW: 15.3 % (ref 11.5–15.5)
RDW: 15.6 % — ABNORMAL HIGH (ref 11.5–15.5)
WBC: 10.5 10*3/uL (ref 4.0–10.5)
WBC: 9.3 10*3/uL (ref 4.0–10.5)
WBC: 11.7 10*3/uL — AB (ref 4.0–10.5)

## 2015-11-12 LAB — URIC ACID
URIC ACID, SERUM: 5.6 mg/dL (ref 2.3–6.6)
URIC ACID, SERUM: 6.3 mg/dL (ref 2.3–6.6)
Uric Acid, Serum: 5.6 mg/dL (ref 2.3–6.6)

## 2015-11-12 LAB — MAGNESIUM
MAGNESIUM: 5.6 mg/dL — AB (ref 1.7–2.4)
Magnesium: 6.4 mg/dL (ref 1.7–2.4)
Magnesium: 6.8 mg/dL (ref 1.7–2.4)
Magnesium: 6.1 mg/dL (ref 1.7–2.4)

## 2015-11-12 LAB — LACTATE DEHYDROGENASE
LDH: 446 U/L — AB (ref 98–192)
LDH: 514 U/L — ABNORMAL HIGH (ref 98–192)

## 2015-11-12 LAB — ABO/RH: ABO/RH(D): A POS

## 2015-11-12 LAB — GROUP B STREP BY PCR: Group B strep by PCR: NEGATIVE

## 2015-11-12 MED ORDER — CEFAZOLIN SODIUM-DEXTROSE 2-3 GM-% IV SOLR
2.0000 g | INTRAVENOUS | Status: AC
Start: 2015-11-13 — End: 2015-11-13
  Administered 2015-11-13: 2 g via INTRAVENOUS
  Filled 2015-11-12: qty 50

## 2015-11-12 MED ORDER — NITROFURANTOIN MONOHYD MACRO 100 MG PO CAPS
100.0000 mg | ORAL_CAPSULE | Freq: Two times a day (BID) | ORAL | Status: DC
  Administered 2015-11-12: 100 mg via ORAL
  Filled 2015-11-12 (×2): qty 1

## 2015-11-12 NOTE — Consult Note (Signed)
Maternal Fetal Medicine Consultation  Requesting Provider(s): Gerald Leitz, MD  [facility]  Reason for consultation: Preeclampsia with severe features / HELLP syndrome  HPI: Marissa Moore is a 30 yo G1P0, EDD 02/20/2016 who is currently at 25w 5d admitted yesterday for suspected HELLP syndrome.  The patient reports an episode of acute RUQ pain yesterday with some pain "on and off" for about a week.  On evaluation yesterday, the patient was noted to have severe range blood pressures - initial BP was 211/124; she required multiple doses of IV Labetalol and Hydralazine.  Since her initial episode, blood pressures have improved - now mostly in the 130s/ 80s.  Admission labs were remarkable for a urine P/C ratio of 2.13; LFTS in the 170-220 mg/dl range and initial platelets of 112 (most recent platelet count 98).  LDH was 446.  The patient is currently on Magnesium sulfate - given her first dose of betamethasone at 2200 hrs last night.  She is currently without complaints.  She denies Headaches or visual changes.  The fetus is active.  OB History: OB History    Gravida Para Term Preterm AB TAB SAB Ectopic Multiple Living   1         0      PMH:  Past Medical History  Diagnosis Date  . Connective tissue disease (HCC)     PSH:  Past Surgical History  Procedure Laterality Date  . Appendectomy    . Wisdom tooth extraction  2008   Meds:  Scheduled Meds: . betamethasone acetate-betamethasone sodium phosphate  12 mg Intramuscular Q24H  . docusate sodium  100 mg Oral Daily  . hydroxychloroquine  400 mg Oral Daily  . prenatal multivitamin  1 tablet Oral Q1200  . sodium chloride  3 mL Intravenous Q12H   Continuous Infusions: . lactated ringers 100 mL/hr at 11/12/15 0944  . magnesium sulfate 2 g/hr (11/11/15 2250)   PRN Meds:.sodium chloride, acetaminophen, calcium carbonate, pantoprazole, sodium chloride, zolpidem   Allergies:  Allergies  Allergen Reactions  . Sulfa  Antibiotics Hives   FH:  Family History  Problem Relation Age of Onset  . Hypertension Maternal Grandmother   . Depression Maternal Grandmother   . Hypertension Paternal Grandmother   . Depression Paternal Grandmother    Soc:  Social History   Social History  . Marital Status: Married    Spouse Name: N/A  . Number of Children: N/A  . Years of Education: N/A   Occupational History  . Not on file.   Social History Main Topics  . Smoking status: Never Smoker   . Smokeless tobacco: Never Used  . Alcohol Use: No  . Drug Use: No  . Sexual Activity: Yes   Other Topics Concern  . Not on file   Social History Narrative  . No narrative on file   PE:   Filed Vitals:   11/12/15 0750 11/12/15 1230  BP: 127/82 137/108  Pulse: 88 101  Temp: 97.4 F (36.3 C)   Resp: 17 16    GEN: well-appearing female ABD: gravid, NT  Ultrasound: Single IUP at 25w 5d Preeclampsia with severe features / HELLP syndrome Cephalic presentation The estimated fetal weight today is at the 24th %tile (692 g) Left, posterior placenta without previa Normal amniotic fluid volume  Labs: CBC    Component Value Date/Time   WBC 9.3 11/12/2015 1040   RBC 3.96 11/12/2015 1040   HGB 12.2 11/12/2015 1040   HCT 35.1* 11/12/2015 1040  PLT 98* 11/12/2015 1040   MCV 88.6 11/12/2015 1040   MCH 30.8 11/12/2015 1040   MCHC 34.8 11/12/2015 1040   RDW 15.3 11/12/2015 1040   LYMPHSABS 1.3 07/31/2007 1236   MONOABS 0.7 07/31/2007 1236   EOSABS 0.0 07/31/2007 1236   BASOSABS 0.1 07/31/2007 1236   CMP     Component Value Date/Time   NA 134* 11/12/2015 1040   K 3.5 11/12/2015 1040   CL 102 11/12/2015 1040   CO2 21* 11/12/2015 1040   GLUCOSE 220* 11/12/2015 1040   BUN 8 11/12/2015 1040   CREATININE 0.87 11/12/2015 1040   CALCIUM 7.2* 11/12/2015 1040   PROT 5.7* 11/12/2015 1040   ALBUMIN 2.8* 11/12/2015 1040   AST 171* 11/12/2015 1040   ALT 223* 11/12/2015 1040   ALKPHOS 215* 11/12/2015 1040    BILITOT 0.8 11/12/2015 1040   GFRNONAA >60 11/12/2015 1040   GFRAA >60 11/12/2015 1040     A/P: 1) Single IUP at 25w 5d  2) HELLP syndrome - LFTs have remained stable since admission; however, platelets have trended downward. RUQ pain has mostly resolved since admission. Blood pressures currently stable now s/p multiple doses of IV antihypertensive medications yesterday.   I had a long discussion with the patient regarding the diagnosis and management of HELLP syndrome.  While there may be a role for expectant management in preeclampsia with severe features, this management option does not apply to HELLP syndrome.  The patient is currently stable, but would recommend moving toward delivery once she completes her betamethasone course.  We also briefly reviewed the route of delivery - the fetus is currently cephalic, but would anticipate a prolonged induction at 25 weeks.  Feel that it would be reasonable to try to achieve a vaginal delivery (to avoid what would likely be a classical C-section), but would follow recommend LFTs / CBCs every 6 hours during induction - in the event that her clinical status worsens or if the fetus does not tolerate labor, would proceed with cesarean delivery.  Recommendations were discussed with Dr. Richardson Doppole.    Thank you for the opportunity to be a part of the care of Marissa Moore. Please contact our office if we can be of further assistance.   I spent approximately 30 minutes with this patient with over 50% of time spent in face-to-face counseling.   Alpha GulaPaul Belvia Gotschall, MD Maternal Fetal Medicine

## 2015-11-12 NOTE — Progress Notes (Signed)
Marissa Moore is a 30 y.o. G1P0 at 3236w5d   Subjective: Pt denies headaches or visual changes.  States RUQ has resolved since admission.  RN states pt was unable to go to ultrasound due to Magnesium infusion.  Ultrasound scheduled for 11 am today.  Objective: BP 127/82 mmHg  Pulse 88  Temp(Src) 97.4 F (36.3 C) (Oral)  Resp 17  Ht 4\' 11"  (1.499 m)  Wt 71.215 kg (157 lb)  BMI 31.69 kg/m2  SpO2 99% I/O last 3 completed shifts: In: 794.2 [I.V.:794.2] Out: 1200 [Urine:1200]  1250/8 hours   Gen:  NAD Abd:  Gravid, no RUQ or fundal tenderness Ext:  SCD on right leg, no excessive swelling in left leg Neuro:  DTR 3+, no clonus FHT:  140s, no accels, variability present UC:   none SVE:    Deferred  Labs: CBC Latest Ref Rng 11/12/2015 11/11/2015 07/31/2007  WBC 4.0 - 10.5 K/uL 10.5 12.0(H) 19.5(H)  Hemoglobin 12.0 - 15.0 g/dL 16.112.8 09.613.9 04.514.9  Hematocrit 36.0 - 46.0 % 35.7(L) 38.6 43.5  Platelets 150 - 400 K/uL 94(L) 112(L) 340   CMP Latest Ref Rng 11/12/2015 11/11/2015 07/31/2007  Glucose 65 - 99 mg/dL 409(W121(H) 90 80  BUN 6 - 20 mg/dL 7 8 6   Creatinine 0.44 - 1.00 mg/dL 1.190.76 1.470.75 8.290.95  Sodium 135 - 145 mmol/L 138 135 134(L)  Potassium 3.5 - 5.1 mmol/L 3.9 3.4(L) 2.9(L)  Chloride 101 - 111 mmol/L 105 100(L) 97  CO2 22 - 32 mmol/L 25 24 28   Calcium 8.9 - 10.3 mg/dL 7.6(L) 8.9 9.0  Total Protein 6.5 - 8.1 g/dL 6.2(L) 6.8 8.3  Total Bilirubin 0.3 - 1.2 mg/dL 0.8 5.6(O1.5(H) 0.9  Alkaline Phos 38 - 126 U/L 229(H) 250(H) 63  AST 15 - 41 U/L 197(H) 217(H) 19  ALT 14 - 54 U/L 234(H) 239(H) 15   Ultrasound pending  Assessment / Plan: IUP at 25 5/7 weeks  Severe Preeclampsia vs. 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 S/p BMZ#1 ~ 10 pm last night. 24 hour dosing.  Preeclampsia:  on magnesium sulfate, no signs or symptoms of toxicity, intake and ouput balanced and Platelets decreased.  Order labs q6 to monitor platelets, LFTS closer. Fetal Wellbeing:  Gibson RampCategory  I  Tyrees Chopin 11/12/2015, 10:57 AM

## 2015-11-12 NOTE — Progress Notes (Signed)
Marissa Moore is a 30 y.o. G1P0 at 2072w5d  admitted for severe preeclampsia and HELLP syndrome   Subjective: Pt reports improvement in RUQ pain. It has resolved. She denies headache visual disturbances or ruq pain.   Objective: BP 149/95 mmHg  Pulse 98  Temp(Src) 97.4 F (36.3 C) (Oral)  Resp 16  Ht 4\' 11"  (1.499 m)  Wt 160 lb 1.6 oz (72.621 kg)  BMI 32.32 kg/m2  SpO2 99% I/O last 3 completed shifts: In: 794.2 [I.V.:794.2] Out: 1200 [Urine:1200] Total I/O In: 1225 [P.O.:600; I.V.:625] Out: 1075 [Urine:1075]  FHT:  FHR: 140 bpm, variability: moderate,  accelerations:  Abscent,  decelerations:  Absent UC:   None CV RRR Lungs Clear bilaterally  Abdomen gravid nontender  Ext no edema... 2+ REFLEXES 1 BEAT OF CLONUS SVE:   Dilation: Closed  Thick and High  Labs: Lab Results  Component Value Date   WBC 9.3 11/12/2015   HGB 12.2 11/12/2015   HCT 35.1* 11/12/2015   MCV 88.6 11/12/2015   PLT 98* 11/12/2015    Assessment / Plan: 1) 25  wks and 5 days with preeclampsia with severe features HELLP syndrome.   Discussed pt with Dr. Olean ReePaul Whitaker. Appreciate MFM consult. Her recommends moving toward delivery after her second dose of steroids. Options discussed were induction at midnight vs cesarean section in AM if pt remains stable overnight. I discussed the pro's and cons of each option with the patient and her husband. We discussed process of induction with cytotec for  Cervical ripening She is informed that induction  could be a very slow process and possible take several days,  however it may be an opportunity to avoid classical cesarean section.  I discussed the option of  primary cesarean section given that she is remote from delivery. She is informed that this would more than likely be a classical incision and any subsequent pregnancies would require repeat cesarean section. We also discussed r/o cesarean section to include but not limited to infection/ bleeding damage to bowel  bladder baby with the need for further surgery. R/O transfusion was discussed HIV/ Hep B&C discussed. Pt voiced understanding and desires to proceed with primary cesarean section in the morning if she remains stable overnight.  -Cesarean section is scheduled for 715 am tomorrow morning. NPO after midnight  -Continue magnesium sulfate 2 grams /hr and for 24 hours post delivery  -labs every 6 hours  -labetalol IV as need for severe range blood pressures  - BMZ #2 at 2200 tonight   2) Connective tissue disorder -Continue plaquenil 400 mg daily ( Rheumatologist is Dr. Zenovia JordanAngela Hawkes)  3)UTI based on nitrates in urine - start macrobid 100 mg bid .Marland Kitchen. Urine culture ordered.  Dr. Dion BodyVarnado covering until 7 pm tonight.Marland Kitchen.Marland Kitchen.Discussed the above with DR. Varnado who will relay information to  Dr. Dois DavenportSandra Rivard covering  After 7 pm tonight until 7 am 11/13/2015     Marissa Moore J. 11/12/2015, 6:09 PM

## 2015-11-12 NOTE — Consult Note (Signed)
Neonatology Consult Note:  At the request of the patients obstetrician Dr. Nelda Marseille I met with Marissa Moore (and her mother) who is currently 25 5 weeks with pregnancy complicated by HELLP syndrome.  She is being managed with labetalol, magnesium sulfate and initial betamethasone dose given 1/15.  Pregnancy also complicated by connective tissue d/o- Plaquenil 244m two tablets daily and GERD- probiotic and Nexium daily. We discussed morbidity/mortality at this gestional age, delivery room resuscitation, including intubation and surfactant in DR.  Discussed mechanical ventilation and risk for chronic lung disease, risk for IVH with potential for motor / cognitive deficits, ROP, NEC, sepsis, as well as temperature instability and feeding immaturity.  Discussed NG / OG feeds, benefits of MBM in reducing incidence of NEC.   Discussed likely length of stay.  Thank you for allowing [facility] participate in her care.  Please call with questions.  BHiginio Roger DO  Neonatologist  The total length of face-to-face or floor / unit time for this encounter was 20 minutes.  Counseling and / or coordination of care was greater than fifty percent of the time.

## 2015-11-13 ENCOUNTER — Encounter (HOSPITAL_COMMUNITY)
Admission: AD | Disposition: A | Discharge status: Home / Self Care | Payer: Self-pay | Source: Ambulatory Visit | Attending: Obstetrics and Gynecology

## 2015-11-13 ENCOUNTER — Encounter (HOSPITAL_COMMUNITY): Payer: Self-pay | Admitting: Anesthesiology

## 2015-11-13 ENCOUNTER — Inpatient Hospital Stay (HOSPITAL_COMMUNITY): Payer: BLUE CROSS/BLUE SHIELD | Admitting: Anesthesiology

## 2015-11-13 LAB — CBC
HCT: 34.4 % — ABNORMAL LOW (ref 36.0–46.0)
HCT: 35.1 % — ABNORMAL LOW (ref 36.0–46.0)
HCT: 32.4 % — ABNORMAL LOW (ref 36.0–46.0)
HCT: 30.4 % — ABNORMAL LOW (ref 36.0–46.0)
HEMOGLOBIN: 10.5 g/dL — AB (ref 12.0–15.0)
HEMOGLOBIN: 12 g/dL (ref 12.0–15.0)
HEMOGLOBIN: 12.1 g/dL (ref 12.0–15.0)
Hemoglobin: 11.2 g/dL — ABNORMAL LOW (ref 12.0–15.0)
MCH: 30.9 pg (ref 26.0–34.0)
MCH: 31.1 pg (ref 26.0–34.0)
MCH: 31.3 pg (ref 26.0–34.0)
MCH: 31.4 pg (ref 26.0–34.0)
MCHC: 34.9 g/dL (ref 30.0–36.0)
MCHC: 34.5 g/dL (ref 30.0–36.0)
MCHC: 34.6 g/dL (ref 30.0–36.0)
MCHC: 34.5 g/dL (ref 30.0–36.0)
MCV: 89.8 fL (ref 78.0–100.0)
MCV: 90.8 fL (ref 78.0–100.0)
MCV: 89.8 fL (ref 78.0–100.0)
MCV: 89.9 fL (ref 78.0–100.0)
PLATELETS: 121 10*3/uL — AB (ref 150–400)
PLATELETS: 125 10*3/uL — AB (ref 150–400)
PLATELETS: 127 10*3/uL — AB (ref 150–400)
Platelets: 132 10*3/uL — ABNORMAL LOW (ref 150–400)
RBC: 3.38 MIL/uL — AB (ref 3.87–5.11)
RBC: 3.57 MIL/uL — ABNORMAL LOW (ref 3.87–5.11)
RBC: 3.83 MIL/uL — AB (ref 3.87–5.11)
RBC: 3.91 MIL/uL (ref 3.87–5.11)
RDW: 15.8 % — ABNORMAL HIGH (ref 11.5–15.5)
RDW: 15.8 % — AB (ref 11.5–15.5)
RDW: 15.7 % — AB (ref 11.5–15.5)
RDW: 15.9 % — ABNORMAL HIGH (ref 11.5–15.5)
WBC: 11.7 10*3/uL — AB (ref 4.0–10.5)
WBC: 11.9 10*3/uL — AB (ref 4.0–10.5)
WBC: 15.7 10*3/uL — ABNORMAL HIGH (ref 4.0–10.5)
WBC: 16 10*3/uL — AB (ref 4.0–10.5)

## 2015-11-13 LAB — COMPREHENSIVE METABOLIC PANEL
ALBUMIN: 2.6 g/dL — AB (ref 3.5–5.0)
ALBUMIN: 2.6 g/dL — AB (ref 3.5–5.0)
ALK PHOS: 194 U/L — AB (ref 38–126)
ALT: 122 U/L — AB (ref 14–54)
ALT: 133 U/L — ABNORMAL HIGH (ref 14–54)
ALT: 165 U/L — AB (ref 14–54)
ALT: 169 U/L — AB (ref 14–54)
ANION GAP: 9 (ref 5–15)
AST: 62 U/L — AB (ref 15–41)
AST: 67 U/L — AB (ref 15–41)
AST: 77 U/L — ABNORMAL HIGH (ref 15–41)
AST: 85 U/L — AB (ref 15–41)
Albumin: 2.7 g/dL — ABNORMAL LOW (ref 3.5–5.0)
Albumin: 2.6 g/dL — ABNORMAL LOW (ref 3.5–5.0)
Alkaline Phosphatase: 199 U/L — ABNORMAL HIGH (ref 38–126)
Alkaline Phosphatase: 172 U/L — ABNORMAL HIGH (ref 38–126)
Alkaline Phosphatase: 165 U/L — ABNORMAL HIGH (ref 38–126)
Anion gap: 7 (ref 5–15)
Anion gap: 9 (ref 5–15)
Anion gap: 6 (ref 5–15)
BUN: 9 mg/dL (ref 6–20)
BUN: 9 mg/dL (ref 6–20)
BUN: 9 mg/dL (ref 6–20)
BUN: 10 mg/dL (ref 6–20)
CHLORIDE: 100 mmol/L — AB (ref 101–111)
CHLORIDE: 101 mmol/L (ref 101–111)
CHLORIDE: 106 mmol/L (ref 101–111)
CHLORIDE: 106 mmol/L (ref 101–111)
CO2: 22 mmol/L (ref 22–32)
CO2: 25 mmol/L (ref 22–32)
CO2: 25 mmol/L (ref 22–32)
CO2: 27 mmol/L (ref 22–32)
CREATININE: 0.76 mg/dL (ref 0.44–1.00)
CREATININE: 0.79 mg/dL (ref 0.44–1.00)
Calcium: 6.3 mg/dL — CL (ref 8.9–10.3)
Calcium: 6.2 mg/dL — CL (ref 8.9–10.3)
Calcium: 6.1 mg/dL — CL (ref 8.9–10.3)
Calcium: 6.2 mg/dL — CL (ref 8.9–10.3)
Creatinine, Ser: 0.78 mg/dL (ref 0.44–1.00)
Creatinine, Ser: 0.72 mg/dL (ref 0.44–1.00)
GFR calc Af Amer: >60 mL/min (ref >60)
GFR calc non Af Amer: >60 mL/min (ref >60)
GFR calc non Af Amer: >60 mL/min (ref >60)
GLUCOSE: 122 mg/dL — AB (ref 65–99)
Glucose, Bld: 124 mg/dL — ABNORMAL HIGH (ref 65–99)
Glucose, Bld: 113 mg/dL — ABNORMAL HIGH (ref 65–99)
Glucose, Bld: 127 mg/dL — ABNORMAL HIGH (ref 65–99)
POTASSIUM: 3.8 mmol/L (ref 3.5–5.1)
POTASSIUM: 4 mmol/L (ref 3.5–5.1)
Potassium: 3.7 mmol/L (ref 3.5–5.1)
Potassium: 3.6 mmol/L (ref 3.5–5.1)
SODIUM: 133 mmol/L — AB (ref 135–145)
SODIUM: 137 mmol/L (ref 135–145)
SODIUM: 138 mmol/L (ref 135–145)
Sodium: 135 mmol/L (ref 135–145)
Total Bilirubin: 0.3 mg/dL (ref 0.3–1.2)
Total Bilirubin: 0.5 mg/dL (ref 0.3–1.2)
Total Bilirubin: 0.4 mg/dL (ref 0.3–1.2)
Total Bilirubin: 0.5 mg/dL (ref 0.3–1.2)
Total Protein: 5.9 g/dL — ABNORMAL LOW (ref 6.5–8.1)
Total Protein: 6 g/dL — ABNORMAL LOW (ref 6.5–8.1)
Total Protein: 5.7 g/dL — ABNORMAL LOW (ref 6.5–8.1)
Total Protein: 5.8 g/dL — ABNORMAL LOW (ref 6.5–8.1)

## 2015-11-13 LAB — LACTATE DEHYDROGENASE
LDH: 368 U/L — ABNORMAL HIGH (ref 98–192)
LDH: 428 U/L — AB (ref 98–192)
LDH: 380 U/L — AB (ref 98–192)
LDH: 470 U/L — ABNORMAL HIGH (ref 98–192)

## 2015-11-13 LAB — MAGNESIUM
MAGNESIUM: 6.3 mg/dL — AB (ref 1.7–2.4)
MAGNESIUM: 6.7 mg/dL — AB (ref 1.7–2.4)
MAGNESIUM: 6.8 mg/dL — AB (ref 1.7–2.4)

## 2015-11-13 LAB — URIC ACID
URIC ACID, SERUM: 6 mg/dL (ref 2.3–6.6)
Uric Acid, Serum: 6.2 mg/dL (ref 2.3–6.6)

## 2015-11-13 SURGERY — Surgical Case
Anesthesia: Spinal

## 2015-11-13 MED ORDER — SODIUM CHLORIDE 0.9 % IR SOLN
Status: DC | PRN
  Administered 2015-11-13: 1000 mL

## 2015-11-13 MED ORDER — DIPHENHYDRAMINE HCL 50 MG/ML IJ SOLN: 12.5000 mg | INTRAMUSCULAR | Status: DC | PRN

## 2015-11-13 MED ORDER — PRENATAL MULTIVITAMIN CH
1.0000 | ORAL_TABLET | Freq: Every day | ORAL | Status: DC
  Administered 2015-11-13: 1 via ORAL
  Filled 2015-11-13 (×3): qty 1

## 2015-11-13 MED ORDER — ACETAMINOPHEN 325 MG PO TABS: 650.0000 mg | ORAL_TABLET | ORAL | Status: DC | PRN

## 2015-11-13 MED ORDER — IBUPROFEN 600 MG PO TABS: 600.0000 mg | ORAL_TABLET | Freq: Four times a day (QID) | ORAL | Status: DC | PRN

## 2015-11-13 MED ORDER — PHENYLEPHRINE 8 MG IN D5W 100 ML (0.08MG/ML) PREMIX OPTIME
INJECTION | INTRAVENOUS | Status: AC
  Filled 2015-11-13: qty 100

## 2015-11-13 MED ORDER — OXYTOCIN 10 UNIT/ML IJ SOLN
INTRAMUSCULAR | Status: DC | PRN
  Administered 2015-11-13: 40 [IU] via INTRAMUSCULAR

## 2015-11-13 MED ORDER — FENTANYL CITRATE (PF) 100 MCG/2ML IJ SOLN
INTRAMUSCULAR | Status: AC
  Filled 2015-11-13: qty 2

## 2015-11-13 MED ORDER — MENTHOL 3 MG MT LOZG
1.0000 | LOZENGE | OROMUCOSAL | Status: DC | PRN
  Filled 2015-11-13: qty 9

## 2015-11-13 MED ORDER — ACETAMINOPHEN 500 MG PO TABS: 1000.0000 mg | ORAL_TABLET | Freq: Four times a day (QID) | ORAL | Status: DC

## 2015-11-13 MED ORDER — NALBUPHINE HCL 10 MG/ML IJ SOLN: 5.0000 mg | Freq: Once | INTRAMUSCULAR | Status: DC | PRN

## 2015-11-13 MED ORDER — NALBUPHINE HCL 10 MG/ML IJ SOLN: 5.0000 mg | INTRAMUSCULAR | Status: DC | PRN

## 2015-11-13 MED ORDER — CITRIC ACID-SODIUM CITRATE 334-500 MG/5ML PO SOLN
ORAL | Status: AC
Start: 2015-11-13 — End: 2015-11-13
  Administered 2015-11-13: 30 mL
  Filled 2015-11-13: qty 15

## 2015-11-13 MED ORDER — ZOLPIDEM TARTRATE 5 MG PO TABS: 5.0000 mg | ORAL_TABLET | Freq: Every evening | ORAL | Status: DC | PRN

## 2015-11-13 MED ORDER — BUPIVACAINE IN DEXTROSE 0.75-8.25 % IT SOLN
INTRATHECAL | Status: DC | PRN
  Administered 2015-11-13: 1.5 mL via INTRATHECAL

## 2015-11-13 MED ORDER — HYDROMORPHONE HCL 1 MG/ML IJ SOLN
0.2500 mg | INTRAMUSCULAR | Status: DC | PRN
  Administered 2015-11-13 (×2): 0.5 mg via INTRAVENOUS

## 2015-11-13 MED ORDER — MEPERIDINE HCL 25 MG/ML IJ SOLN: 6.2500 mg | INTRAMUSCULAR | Status: DC | PRN

## 2015-11-13 MED ORDER — OXYTOCIN 10 UNIT/ML IJ SOLN: 2.5000 [IU]/h | INTRAVENOUS | Status: AC

## 2015-11-13 MED ORDER — FENTANYL CITRATE (PF) 100 MCG/2ML IJ SOLN
INTRAMUSCULAR | Status: DC | PRN
  Administered 2015-11-13: 10 ug via INTRATHECAL
  Administered 2015-11-13: 50 ug via INTRAVENOUS
  Administered 2015-11-13: 25 ug via INTRAVENOUS

## 2015-11-13 MED ORDER — SODIUM CHLORIDE 0.9 % IJ SOLN
3.0000 mL | INTRAMUSCULAR | Status: DC | PRN
  Administered 2015-11-15 – 2015-11-16 (×2): 3 mL via INTRAVENOUS
  Filled 2015-11-13 (×2): qty 3

## 2015-11-13 MED ORDER — KETOROLAC TROMETHAMINE 30 MG/ML IJ SOLN
30.0000 mg | Freq: Four times a day (QID) | INTRAMUSCULAR | Status: DC | PRN
  Filled 2015-11-13: qty 1

## 2015-11-13 MED ORDER — DIBUCAINE 1 % RE OINT: 1.0000 "application " | TOPICAL_OINTMENT | RECTAL | Status: DC | PRN

## 2015-11-13 MED ORDER — NALOXONE HCL 0.4 MG/ML IJ SOLN: 0.4000 mg | INTRAMUSCULAR | Status: DC | PRN

## 2015-11-13 MED ORDER — DIPHENHYDRAMINE HCL 25 MG PO CAPS
25.0000 mg | ORAL_CAPSULE | Freq: Four times a day (QID) | ORAL | Status: DC | PRN
  Filled 2015-11-13: qty 1

## 2015-11-13 MED ORDER — SIMETHICONE 80 MG PO CHEW
80.0000 mg | CHEWABLE_TABLET | ORAL | Status: DC | PRN
  Filled 2015-11-13 (×2): qty 1

## 2015-11-13 MED ORDER — MORPHINE SULFATE (PF) 0.5 MG/ML IJ SOLN
INTRAMUSCULAR | Status: DC | PRN
  Administered 2015-11-13: 1 mg via INTRAVENOUS
  Administered 2015-11-13: .2 mg via INTRATHECAL

## 2015-11-13 MED ORDER — ONDANSETRON HCL 4 MG/2ML IJ SOLN
INTRAMUSCULAR | Status: AC
  Filled 2015-11-13: qty 2

## 2015-11-13 MED ORDER — SCOPOLAMINE 1 MG/3DAYS TD PT72
MEDICATED_PATCH | TRANSDERMAL | Status: DC | PRN
  Administered 2015-11-13: 1 via TRANSDERMAL

## 2015-11-13 MED ORDER — FERROUS SULFATE 325 (65 FE) MG PO TABS
325.0000 mg | ORAL_TABLET | Freq: Two times a day (BID) | ORAL | Status: DC
  Administered 2015-11-13 – 2015-11-17 (×8): 325 mg via ORAL
  Filled 2015-11-13 (×8): qty 1

## 2015-11-13 MED ORDER — ONDANSETRON HCL 4 MG/2ML IJ SOLN: 4.0000 mg | Freq: Three times a day (TID) | INTRAMUSCULAR | Status: DC | PRN

## 2015-11-13 MED ORDER — HYDROMORPHONE HCL 1 MG/ML IJ SOLN
INTRAMUSCULAR | Status: AC
  Filled 2015-11-13: qty 1

## 2015-11-13 MED ORDER — CEFAZOLIN SODIUM-DEXTROSE 2-3 GM-% IV SOLR
INTRAVENOUS | Status: AC
  Filled 2015-11-13: qty 50

## 2015-11-13 MED ORDER — IBUPROFEN 600 MG PO TABS
600.0000 mg | ORAL_TABLET | Freq: Four times a day (QID) | ORAL | Status: DC
  Administered 2015-11-13 – 2015-11-17 (×17): 600 mg via ORAL
  Filled 2015-11-13 (×17): qty 1

## 2015-11-13 MED ORDER — LACTATED RINGERS IV SOLN
INTRAVENOUS | Status: DC
  Administered 2015-11-13: 22:00:00 via INTRAVENOUS

## 2015-11-13 MED ORDER — CALCIUM CARBONATE ANTACID 500 MG PO CHEW
400.0000 mg | CHEWABLE_TABLET | Freq: Two times a day (BID) | ORAL | Status: DC
  Administered 2015-11-13 – 2015-11-17 (×7): 400 mg via ORAL
  Filled 2015-11-13 (×8): qty 2

## 2015-11-13 MED ORDER — SODIUM CHLORIDE 0.9 % IJ SOLN
INTRAMUSCULAR | Status: AC
Start: 2015-11-13 — End: 2015-11-13
  Filled 2015-11-13: qty 9

## 2015-11-13 MED ORDER — ONDANSETRON HCL 4 MG/2ML IJ SOLN
INTRAMUSCULAR | Status: DC | PRN
Start: 2015-11-13 — End: 2015-11-13
  Administered 2015-11-13: 4 mg via INTRAVENOUS

## 2015-11-13 MED ORDER — MORPHINE SULFATE (PF) 0.5 MG/ML IJ SOLN
INTRAMUSCULAR | Status: AC
  Filled 2015-11-13: qty 10

## 2015-11-13 MED ORDER — TETANUS-DIPHTH-ACELL PERTUSSIS 5-2.5-18.5 LF-MCG/0.5 IM SUSP
0.5000 mL | Freq: Once | INTRAMUSCULAR | Status: DC
  Filled 2015-11-13: qty 0.5

## 2015-11-13 MED ORDER — SIMETHICONE 80 MG PO CHEW
80.0000 mg | CHEWABLE_TABLET | Freq: Three times a day (TID) | ORAL | Status: DC
  Administered 2015-11-13 – 2015-11-17 (×11): 80 mg via ORAL
  Filled 2015-11-13 (×12): qty 1

## 2015-11-13 MED ORDER — LACTATED RINGERS IV SOLN
INTRAVENOUS | Status: DC | PRN
  Administered 2015-11-13: 08:00:00 via INTRAVENOUS

## 2015-11-13 MED ORDER — PROMETHAZINE HCL 25 MG/ML IJ SOLN: 6.2500 mg | INTRAMUSCULAR | Status: DC | PRN

## 2015-11-13 MED ORDER — LANOLIN HYDROUS EX OINT: 1.0000 "application " | TOPICAL_OINTMENT | CUTANEOUS | Status: DC | PRN

## 2015-11-13 MED ORDER — SIMETHICONE 80 MG PO CHEW
80.0000 mg | CHEWABLE_TABLET | ORAL | Status: DC
  Administered 2015-11-13 – 2015-11-16 (×4): 80 mg via ORAL
  Filled 2015-11-13 (×2): qty 1

## 2015-11-13 MED ORDER — KETOROLAC TROMETHAMINE 30 MG/ML IJ SOLN: 30.0000 mg | Freq: Four times a day (QID) | INTRAMUSCULAR | Status: DC

## 2015-11-13 MED ORDER — DEXTROSE 5 % IV SOLN
1.0000 ug/kg/h | INTRAVENOUS | Status: DC | PRN
  Filled 2015-11-13: qty 2

## 2015-11-13 MED ORDER — PHENYLEPHRINE 8 MG IN D5W 100 ML (0.08MG/ML) PREMIX OPTIME
INJECTION | INTRAVENOUS | Status: DC | PRN
  Administered 2015-11-13: 20 ug/min via INTRAVENOUS

## 2015-11-13 MED ORDER — SENNOSIDES-DOCUSATE SODIUM 8.6-50 MG PO TABS
2.0000 | ORAL_TABLET | ORAL | Status: DC
  Administered 2015-11-13 – 2015-11-16 (×3): 2 via ORAL
  Filled 2015-11-13 (×5): qty 2

## 2015-11-13 MED ORDER — MAGNESIUM SULFATE 50 % IJ SOLN
2.0000 g/h | INTRAVENOUS | Status: DC
  Administered 2015-11-13: 2 g/h via INTRAVENOUS
  Filled 2015-11-13: qty 80

## 2015-11-13 MED ORDER — OXYTOCIN 10 UNIT/ML IJ SOLN
INTRAMUSCULAR | Status: AC
  Filled 2015-11-13: qty 4

## 2015-11-13 MED ORDER — MAGNESIUM SULFATE 50 % IJ SOLN
1.0000 g/h | INTRAVENOUS | Status: DC
  Filled 2015-11-13: qty 80

## 2015-11-13 MED ORDER — WITCH HAZEL-GLYCERIN EX PADS: 1.0000 "application " | MEDICATED_PAD | CUTANEOUS | Status: DC | PRN

## 2015-11-13 MED ORDER — EPHEDRINE 5 MG/ML INJ
INTRAVENOUS | Status: AC
  Filled 2015-11-13: qty 10

## 2015-11-13 MED ORDER — DIPHENHYDRAMINE HCL 25 MG PO CAPS
25.0000 mg | ORAL_CAPSULE | ORAL | Status: DC | PRN
  Administered 2015-11-13: 25 mg via ORAL

## 2015-11-13 MED ORDER — SCOPOLAMINE 1 MG/3DAYS TD PT72
1.0000 | MEDICATED_PATCH | Freq: Once | TRANSDERMAL | Status: DC
  Filled 2015-11-13: qty 1

## 2015-11-13 SURGICAL SUPPLY — 51 items
APPLICATOR ARISTA FLEXITIP XL (MISCELLANEOUS) ×2 IMPLANT
BARRIER ADHS 3X4 INTERCEED (GAUZE/BANDAGES/DRESSINGS) IMPLANT
BENZOIN TINCTURE PRP APPL 2/3 (GAUZE/BANDAGES/DRESSINGS) ×2 IMPLANT
CLAMP CORD UMBIL (MISCELLANEOUS) IMPLANT
CLOTH BEACON ORANGE TIMEOUT ST (SAFETY) ×2 IMPLANT
CONTAINER PREFILL 10% NBF 15ML (MISCELLANEOUS) IMPLANT
DRAPE SHEET LG 3/4 BI-LAMINATE (DRAPES) IMPLANT
DRSG OPSITE POSTOP 4X10 (GAUZE/BANDAGES/DRESSINGS) ×2 IMPLANT
DURAPREP 26ML APPLICATOR (WOUND CARE) ×2 IMPLANT
ELECT REM PT RETURN 9FT ADLT (ELECTROSURGICAL) ×2
ELECTRODE REM PT RTRN 9FT ADLT (ELECTROSURGICAL) ×1 IMPLANT
EXTRACTOR VACUUM M CUP 4 TUBE (SUCTIONS) IMPLANT
GLOVE BIOGEL M 6.5 STRL (GLOVE) ×4 IMPLANT
GLOVE BIOGEL PI IND STRL 6.5 (GLOVE) ×1 IMPLANT
GLOVE BIOGEL PI IND STRL 7.0 (GLOVE) ×1 IMPLANT
GLOVE BIOGEL PI INDICATOR 6.5 (GLOVE) ×1
GLOVE BIOGEL PI INDICATOR 7.0 (GLOVE) ×1
GLOVE ECLIPSE 6.5 STRL STRAW (GLOVE) ×2 IMPLANT
GOWN STRL REUS W/TWL LRG LVL3 (GOWN DISPOSABLE) ×6 IMPLANT
KIT ABG SYR 3ML LUER SLIP (SYRINGE) IMPLANT
NEEDLE HYPO 25X5/8 SAFETYGLIDE (NEEDLE) IMPLANT
NS IRRIG 1000ML POUR BTL (IV SOLUTION) ×2 IMPLANT
PACK C SECTION WH (CUSTOM PROCEDURE TRAY) ×2 IMPLANT
PAD ABD 7.5X8 STRL (GAUZE/BANDAGES/DRESSINGS) ×2 IMPLANT
PAD ABD 8X7 1/2 STERILE (GAUZE/BANDAGES/DRESSINGS) ×2 IMPLANT
PAD OB MATERNITY 4.3X12.25 (PERSONAL CARE ITEMS) ×2 IMPLANT
PENCIL SMOKE EVAC W/HOLSTER (ELECTROSURGICAL) ×2 IMPLANT
RETAINER VISCERAL (MISCELLANEOUS) ×2 IMPLANT
RTRCTR C-SECT PINK 25CM LRG (MISCELLANEOUS) IMPLANT
RTRCTR C-SECT PINK 34CM XLRG (MISCELLANEOUS) IMPLANT
SPONGE GAUZE 4X4 12PLY (GAUZE/BANDAGES/DRESSINGS) ×2 IMPLANT
SPONGE GAUZE 4X4 12PLY STER LF (GAUZE/BANDAGES/DRESSINGS) ×4 IMPLANT
SPONGE LAP 18X18 X RAY DECT (DISPOSABLE) ×4 IMPLANT
STRIP CLOSURE SKIN 1/2X4 (GAUZE/BANDAGES/DRESSINGS) ×2 IMPLANT
SUT CHROMIC 3 0 SH 27 (SUTURE) ×2 IMPLANT
SUT MON AB 3-0 SH 27 (SUTURE) ×1
SUT MON AB 3-0 SH27 (SUTURE) ×1 IMPLANT
SUT PDS AB 0 CT1 27 (SUTURE) ×4 IMPLANT
SUT PLAIN 0 NONE (SUTURE) IMPLANT
SUT VIC AB 0 CTX 36 (SUTURE) ×5
SUT VIC AB 0 CTX36XBRD ANBCTRL (SUTURE) ×5 IMPLANT
SUT VIC AB 2-0 CT1 27 (SUTURE) ×2
SUT VIC AB 2-0 CT1 TAPERPNT 27 (SUTURE) ×2 IMPLANT
SUT VIC AB 2-0 SH 27 (SUTURE) ×1
SUT VIC AB 2-0 SH 27XBRD (SUTURE) ×1 IMPLANT
SUT VIC AB 3-0 SH 27 (SUTURE)
SUT VIC AB 3-0 SH 27X BRD (SUTURE) IMPLANT
SUT VIC AB 4-0 KS 27 (SUTURE) ×2 IMPLANT
TOWEL OR 17X24 6PK STRL BLUE (TOWEL DISPOSABLE) ×2 IMPLANT
TRAY FOLEY CATH SILVER 14FR (SET/KITS/TRAYS/PACK) ×2 IMPLANT
YANKAUER SUCT BULB TIP NO VENT (SUCTIONS) ×2 IMPLANT

## 2015-11-13 NOTE — Anesthesia Postprocedure Evaluation (Signed)
Anesthesia Post Note  Patient: Marissa Moore  Procedure(s) Performed: Procedure(s) (LRB): CESAREAN SECTION (N/A)  Patient location during evaluation: PACU Anesthesia Type: Spinal Level of consciousness: awake Pain management: pain level controlled Vital Signs Assessment: post-procedure vital signs reviewed and stable Respiratory status: spontaneous breathing Cardiovascular status: stable Postop Assessment: no headache, no backache, spinal receding, patient able to bend at knees and no signs of nausea or vomiting Anesthetic complications: no    Last Vitals:  Filed Vitals:   11/13/15 1031 11/13/15 1041  BP:  143/90  Pulse:  85  Temp: 36.9 C   Resp:      Last Pain:  Filed Vitals:   11/13/15 1041  PainSc: 5                  Creighton Longley JR,JOHN Merrie Epler

## 2015-11-13 NOTE — Anesthesia Procedure Notes (Signed)
Spinal Patient location during procedure: OR Start time: 11/13/2015 7:28 AM End time: 11/13/2015 7:31 AM Staffing Anesthesiologist: Leilani Able Performed by: anesthesiologist  Preanesthetic Checklist Completed: patient identified, surgical consent, pre-op evaluation, timeout performed, IV checked, risks and benefits discussed and monitors and equipment checked Spinal Block Patient position: sitting Prep: site prepped and draped and DuraPrep Patient monitoring: heart rate, cardiac monitor, continuous pulse ox and blood pressure Approach: midline Location: L3-4 Injection technique: single-shot Needle Needle type: Sprotte  Needle gauge: 24 G Needle length: 9 cm Needle insertion depth: 6 cm Assessment Sensory level: T2

## 2015-11-13 NOTE — Anesthesia Preprocedure Evaluation (Signed)
Anesthesia Evaluation  Patient identified by MRN, date of birth, ID band Patient awake    Reviewed: Allergy & Precautions, H&P , NPO status , Patient's Chart, lab work & pertinent test results  Airway Mallampati: II  TM Distance: >3 FB Neck ROM: full    Dental no notable dental hx.    Pulmonary neg pulmonary ROS,    Pulmonary exam normal        Cardiovascular negative cardio ROS Normal cardiovascular exam     Neuro/Psych negative neurological ROS  negative psych ROS   GI/Hepatic negative GI ROS, Neg liver ROS,   Endo/Other  negative endocrine ROS  Renal/GU negative Renal ROS     Musculoskeletal   Abdominal (+) + obese,   Peds  Hematology negative hematology ROS (+)   Anesthesia Other Findings   Reproductive/Obstetrics (+) Pregnancy                             Anesthesia Physical Anesthesia Plan  ASA: III  Anesthesia Plan: Spinal   Post-op Pain Management:    Induction:   Airway Management Planned:   Additional Equipment:   Intra-op Plan:   Post-operative Plan:   Informed Consent: I have reviewed the patients History and Physical, chart, labs and discussed the procedure including the risks, benefits and alternatives for the proposed anesthesia with the patient or authorized representative who has indicated his/her understanding and acceptance.     Plan Discussed with: CRNA and Surgeon  Anesthesia Plan Comments:         Anesthesia Quick Evaluation

## 2015-11-13 NOTE — Transfer of Care (Signed)
Immediate Anesthesia Transfer of Care Note  Patient: Marissa Moore  Procedure(s) Performed: Procedure(s): CESAREAN SECTION (N/A)  Patient Location: PACU  Anesthesia Type:Spinal  Level of Consciousness: awake, alert  and oriented  Airway & Oxygen Therapy: Patient Spontanous Breathing and Patient connected to nasal cannula oxygen  Post-op Assessment: Report given to RN and Post -op Vital signs reviewed and stable  Post vital signs: Reviewed and stable  Last Vitals:  Filed Vitals:   11/13/15 0215 11/13/15 0614  BP: 152/93 142/93  Pulse: 107 90  Temp:  37.1 C  Resp: 16 18    Complications: No apparent anesthesia complications

## 2015-11-13 NOTE — Op Note (Signed)
Classical Cesarean Section Procedure Note  Indications: severe preeclampsia/ HEELP syndrome remote from delivery  At 26 wks and 6 days   Pre-operative Diagnosis: 25 week 6 day pregnancy.  Post-operative Diagnosis: same  Surgeon: Jessee Avers. M.D.  Assistants: Dr. Geryl Rankins assisted with surgery due to the complexity of the surgery   Anesthesia: Spinal anesthesia  ASA Class: 2   Procedure Details   The patient was seen in the Holding Room. The risks, benefits, complications, treatment options, and expected outcomes were discussed with the patient.  The patient concurred with the proposed plan, giving informed consent.  The site of surgery properly noted/marked. The patient was taken to Operating Room # 9, identified as Marissa Moore and the procedure verified as C-Section Delivery. A Time Out was held and the above information confirmed.  After induction of anesthesia, the patient was draped and prepped in the usual sterile manner. A Pfannenstiel incision was made and carried down through the subcutaneous tissue to the fascia. Fascial incision was made and extended transversely. The fascia was separated from the underlying rectus tissue superiorly and inferiorly. The peritoneum was identified and entered. Peritoneal incision was extended longitudinally. The utero-vesical peritoneal reflection was incised transversely and the bladder flap was bluntly freed from the lower uterine segment. A Classical  uterine incision was made. Delivered from cephalic presentation was a  Female with Apgar scores of 5 at one minute and 9 at five minutes. After the umbilical cord was clamped and cut cord blood was obtained for evaluation. The placenta was removed intact and appeared normal. The uterine outline, tubes and ovaries appeared normal. The uterine incision was closed with running locked sutures of 0 vicryl. This was closed in 2 layers using Runing locked sutures of 0 vicryl. . The serosa was  reapproximated with 3-0 monocryl. Hemostasis was observed.  However slight oozing was noted form the mid portion of the incision. Arista was applied. Hemostasis was noted. Lavage was carried out until clear. The fascia was then reapproximated with running sutures of 0 PDS. The skin was reapproximated with 4-0 vicryl .  Instrument, sponge, and needle counts were correct prior the abdominal closure and at the conclusion of the case.   Findings: The lower uterine segement was not well developed. Normal preterm female infant in the cephalic presentation. 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 Normal fallopian tubes and ovaries bilaterally.   Estimated Blood Loss:  650 mL         Drains: Foley catheter          Total IV Fluids:   Per anesthesia 1200 ml         Specimens: Placenta and Disposition:  Sent to Pathology          Implants: None         Complications:  None; patient tolerated the procedure well.         Disposition: PACU - hemodynamically stable.         Condition: stable  Attending Attestation: I performed the procedure.

## 2015-11-13 NOTE — H&P (Signed)
Date of Initial H&P:  Pt G1P0 at 25 wks and 6 days with preeclampsia with severe features and HELLP syndrome. Remote from delivery. Pt has opted for cesarean section... Platelets 125 at 5 am History reviewed, patient examined, no change in status, stable for surgery.

## 2015-11-13 NOTE — Lactation Note (Signed)
This note was copied from the chart of Girl Vayla Wilhelmi. Lactation Consultation Note  Patient Name: Girl Marlynn Hinckley [id number] Date: 11/13/2015 Reason for consult: Initial assessment;NICU baby  NICU baby 5 hours old. Mom reports that she has just finished pumping and will pump again at 1600. Assisted mom with hand expression with no colostrum present. Enc mom to pump 8 times/24 hours followed by hand expression. Mom given NICU brochure with review. Enc mom to call insurance company when able in order to inquire about DEBP. Discussed EBM storage guidelines, and enc taking/sending colostrum to NICU for baby. Discussed normal progression of milk coming to volume.  Maternal Data    Feeding    LATCH Score/Interventions                      Lactation Tools Discussed/Used Pump Review: Setup, frequency, and cleaning;Milk Storage Initiated by:: JW Date initiated:: 11/14/15   Consult Status Consult Status: Follow-up Date: 11/14/15 Follow-up type: In-patient    Geralynn Ochs 11/13/2015, 1:29 PM

## 2015-11-14 ENCOUNTER — Encounter (HOSPITAL_COMMUNITY): Payer: Self-pay | Admitting: Obstetrics and Gynecology

## 2015-11-14 LAB — COMPREHENSIVE METABOLIC PANEL
ALK PHOS: 147 U/L — AB (ref 38–126)
ALT: 100 U/L — AB (ref 14–54)
ANION GAP: 9 (ref 5–15)
AST: 51 U/L — AB (ref 15–41)
Albumin: 2.6 g/dL — ABNORMAL LOW (ref 3.5–5.0)
BILIRUBIN TOTAL: 0.5 mg/dL (ref 0.3–1.2)
BUN: 12 mg/dL (ref 6–20)
CALCIUM: 6.9 mg/dL — AB (ref 8.9–10.3)
CO2: 25 mmol/L (ref 22–32)
CREATININE: 0.77 mg/dL (ref 0.44–1.00)
Chloride: 103 mmol/L (ref 101–111)
GFR calc Af Amer: >60 mL/min (ref >60)
GFR calc non Af Amer: >60 mL/min (ref >60)
GLUCOSE: 107 mg/dL — AB (ref 65–99)
Potassium: 3.9 mmol/L (ref 3.5–5.1)
Sodium: 137 mmol/L (ref 135–145)
TOTAL PROTEIN: 5.7 g/dL — AB (ref 6.5–8.1)

## 2015-11-14 LAB — CBC
HCT: 32.5 % — ABNORMAL LOW (ref 36.0–46.0)
HEMATOCRIT: 29.7 % — AB (ref 36.0–46.0)
HEMOGLOBIN: 10.2 g/dL — AB (ref 12.0–15.0)
HEMOGLOBIN: 10.9 g/dL — AB (ref 12.0–15.0)
MCH: 32.2 pg (ref 26.0–34.0)
MCH: 31 pg (ref 26.0–34.0)
MCHC: 34.3 g/dL (ref 30.0–36.0)
MCHC: 33.5 g/dL (ref 30.0–36.0)
MCV: 93.7 fL (ref 78.0–100.0)
MCV: 92.3 fL (ref 78.0–100.0)
PLATELETS: 127 10*3/uL — AB (ref 150–400)
Platelets: 152 10*3/uL (ref 150–400)
RBC: 3.17 MIL/uL — ABNORMAL LOW (ref 3.87–5.11)
RBC: 3.52 MIL/uL — ABNORMAL LOW (ref 3.87–5.11)
RDW: 15.9 % — ABNORMAL HIGH (ref 11.5–15.5)
RDW: 15.9 % — AB (ref 11.5–15.5)
WBC: 14.4 10*3/uL — AB (ref 4.0–10.5)
WBC: 14 10*3/uL — ABNORMAL HIGH (ref 4.0–10.5)

## 2015-11-14 LAB — CULTURE, BETA STREP (GROUP B ONLY)

## 2015-11-14 LAB — URINE CULTURE

## 2015-11-14 MED ORDER — NIFEDIPINE ER OSMOTIC RELEASE 30 MG PO TB24
30.0000 mg | ORAL_TABLET | Freq: Every day | ORAL | Status: DC
  Administered 2015-11-14 – 2015-11-15 (×2): 30 mg via ORAL
  Filled 2015-11-14 (×2): qty 1

## 2015-11-14 MED ORDER — HYDRALAZINE HCL 20 MG/ML IJ SOLN: 10.0000 mg | Freq: Once | INTRAMUSCULAR | Status: DC | PRN

## 2015-11-14 MED ORDER — LABETALOL HCL 5 MG/ML IV SOLN
20.0000 mg | INTRAVENOUS | Status: DC | PRN
  Administered 2015-11-14: 40 mg via INTRAVENOUS
  Administered 2015-11-14: 20 mg via INTRAVENOUS
  Filled 2015-11-14: qty 8
  Filled 2015-11-14: qty 4
  Filled 2015-11-14: qty 16

## 2015-11-14 NOTE — Lactation Note (Signed)
This note was copied from the chart of Marissa Moore. Lactation Consultation Note  Patient Name: Marissa Keyle Doby [id number] Date: 11/14/2015 Reason for consult: Follow-up assessment;NICU baby  NICU baby 26 hours. Mom using DEBP when this LC entered room. Mom able to obtain a drop of colostrum while pumping, which was placed in colostrum container and FOB states he will take to NICU. Assisted mom with hand expression with no additional colostrum expressible. Enc mom to continue pumping 8 times/24 hours for 15 minutes followed by hand expression. Enc mom to call her insurance company about DEBP, discussed 2-week rental and mom has paperwork for the pump. Enc mom offer STS/Kangaroo care as she and baby able, and to take pictures of baby to view while pumping.   This LC called asked LC secretary Sophie to cancel this patient's prenatal classes for 12-25-15, and gave the patient the phone number to inquire about refund.  Maternal Data Has patient been taught Hand Expression?: Yes Does the patient have breastfeeding experience prior to this delivery?: No  Feeding    LATCH Score/Interventions                      Lactation Tools Discussed/Used     Consult Status Consult Status: Follow-up Date: 11/15/15 Follow-up type: In-patient    Geralynn Ochs 11/14/2015, 10:10 AM

## 2015-11-14 NOTE — Progress Notes (Signed)
Foley cath d/c'd, tip intact

## 2015-11-14 NOTE — Progress Notes (Signed)
Subjective: Postpartum Day 1: Cesarean Delivery Patient reports incisional pain, tolerating PO and + flatus.  Pt denies headache visual disturbances or ruq pain   Objective: Vital signs in last 24 hours: Temp:  [97.6 F (36.4 C)-99.1 F (37.3 C)] 99.1 F (37.3 C) (01/18 1211) Pulse Rate:  [63-90] 83 (01/18 1548) Resp:  [16-20] 16 (01/18 1548) BP: (126-195)/(69-103) 143/86 mmHg (01/18 1548) SpO2:  [94 %-100 %] 94 % (01/17 1900) Weight:  [166 lb 12.8 oz (75.66 kg)] 166 lb 12.8 oz (75.66 kg) (01/18 4098)  Physical Exam:  General: alert and cooperative  CV RRR Lungs clear bilaterally  Abdomen soft appropriately tender nondistended +BS Lochia: appropriate Uterine Fundus: firm Incision: bandage is clean dry and intact  DVT Evaluation: No evidence of DVT seen on physical exam.   Recent Labs  11/14/15 0630 11/14/15 1445  HGB 10.2* 10.9*  HCT 29.7* 32.5*    Assessment/Plan: Status post Cesarean section. Doing well postoperatively.  Preeclampsia with severe features and HELLP syndrome Labs improving . Discontinue magnesium sulfate BP management. Will start Procardia XL 30 mg daily Encourage Ambulation TID Lactation consultation Dr. Dion Body covering 11/15/2015 starting at 7 am  Marissa Moore J. 11/14/2015, 6:01 PM

## 2015-11-15 ENCOUNTER — Encounter (HOSPITAL_COMMUNITY): Payer: Self-pay | Admitting: Obstetrics and Gynecology

## 2015-11-15 ENCOUNTER — Encounter (HOSPITAL_COMMUNITY): Payer: Self-pay

## 2015-11-15 MED ORDER — NIFEDIPINE ER OSMOTIC RELEASE 30 MG PO TB24: 60.0000 mg | ORAL_TABLET | Freq: Every day | ORAL | Status: DC

## 2015-11-15 MED ORDER — HYDRALAZINE HCL 20 MG/ML IJ SOLN
5.0000 mg | Freq: Once | INTRAMUSCULAR | Status: AC
  Administered 2015-11-15: 5 mg via INTRAVENOUS
  Filled 2015-11-15: qty 1

## 2015-11-15 MED ORDER — NIFEDIPINE ER OSMOTIC RELEASE 30 MG PO TB24
30.0000 mg | ORAL_TABLET | Freq: Once | ORAL | Status: AC
  Administered 2015-11-15: 30 mg via ORAL
  Filled 2015-11-15: qty 1

## 2015-11-15 NOTE — Progress Notes (Signed)
Patient wheeled by husband to NICU to see baby.

## 2015-11-15 NOTE — Progress Notes (Signed)
Subjective: Postpartum Day 2: Cesarean Delivery Patient reports she is doing well, feels well. Pain controlled. +BM yesterday.  Ambulating to bathroom but not in halls or to NICU. Denies headache visual disturbances or ruq pain   Objective: Vital signs in last 24 hours: Temp:  [98.2 F (36.8 C)-99.1 F (37.3 C)] 98.2 F (36.8 C) (01/18 2021) Pulse Rate:  [72-90] 89 (01/19 0635) Resp:  [16-20] 18 (01/19 0153) BP: (133-154)/(69-91) 150/89 mmHg (01/19 0635) Weight:  [75.66 kg (166 lb 12.8 oz)] 75.66 kg (166 lb 12.8 oz) (01/18 0981)  Physical Exam:  General: alert and cooperative  Abdomen soft appropriately tender nondistended.  Honeycomb dressing soiled but does not appear to be new. Uterine Fundus: firm DVT Evaluation: No evidence of DVT seen on physical exam.   Recent Labs  11/14/15 0630 11/14/15 1445  HGB 10.2* 10.9*  HCT 29.7* 32.5*   CMP Latest Ref Rng 11/14/2015 11/13/2015 11/13/2015  Glucose 65 - 99 mg/dL 191(Y) 782(N) 562(Z)  BUN 6 - 20 mg/dL Creatinine 0.44 - 1.00 mg/dL 3.08 6.57 8.46  Sodium 135 - 145 mmol/L 137 133(L) 135  Potassium 3.5 - 5.1 mmol/L 3.9 3.6 4.0  Chloride 101 - 111 mmol/L 103 100(L) 101  CO2 22 - 32 mmol/L Calcium 8.9 - 10.3 mg/dL 6.9(L) 6.2(LL) 6.1(LL)  Total Protein 6.5 - 8.1 g/dL 9.6(E) 9.5(M) 5.7(L)  Total Bilirubin 0.3 - 1.2 mg/dL 0.5 0.5 0.4  Alkaline Phos 38 - 126 U/L 147(H) 165(H) 172(H)  AST 15 - 41 U/L 51(H) 67(H) 62(H)  ALT 14 - 54 U/L 100(H) 122(H) 133(H)     Assessment/Plan: Status post Cesarean section. Doing well postoperatively.  Preeclampsia with severe features and HELLP syndrome, s/p Magnesium sulfate.  Labs improving . On Procardia XL 30 mg daily.  BP elevated x 1 this am.  Increase to 60 mg prn. Encourage Ambulation in halls TID Dr. Richardson Dopp to resume care tomorrow at 0700. Marissa Moore 11/15/2015, 8:23 AM

## 2015-11-15 NOTE — Lactation Note (Signed)
This note was copied from the chart of Marissa Moore. Lactation Consultation Note  Follow up visit made.  Mom is pumping every few hours and obtaining a few mls. No questions/concerns at present.  Patient Name: Marissa Elveria Lauderbaugh [id number] Date: 11/15/2015     Maternal Data    Feeding    LATCH Score/Interventions                      Lactation Tools Discussed/Used     Consult Status      Huston Foley 11/15/2015, 2:02 PM

## 2015-11-15 NOTE — Progress Notes (Signed)
Pt just receiving bad news of a family member that passed away unexpectantly.

## 2015-11-15 NOTE — Clinical Social Work Maternal (Signed)
CLINICAL SOCIAL WORK MATERNAL/CHILD NOTE  Patient Details  Name: Marissa Moore MRN: [id number] Date of Birth: 10-21-1986  Date:  11/15/2015  Clinical Social Worker Initiating Note:  Dede Dobesh E. Brigitte Pulse, [place] Date/ Time Initiated:  11/15/15/1030     Child's Name:  Marissa Moore   Legal Guardian:   (Parents: Marissa Moore and Marissa Moore)   Need for Interpreter:  None   Date of Referral:        Reason for Referral:   (No referral-NICU admission)   Referral Source:      Address:  [address], [place], [place] [address]  Phone number:  [phone]   Household Members:  Spouse   Natural Supports (not living in the home):  Immediate Family, Friends, Extended Family (MOB reports that she has a great support system through her family, friends and church.  Her friend "who is like a sister"/Marissa Moore was with her today.)   Professional Supports:     Employment: Full-time   Type of Work:  (MOB works for [employer].  FOB works for the [employer])   Education:  Production designer, theatre/television/film (MOB has a Oceanographer in [employer].  FOB is currently in graduate school at [employer] for a dual masters.)   Financial Resources:  [employer]   Other Resources:      Cultural/Religious Considerations Which May Impact Care: None stated  Strengths:  Ability to meet basic needs , Compliance with medical plan , Understanding of illness (Did not discuss pediatrician or home preparedness at this time.)   Risk Factors/Current Problems:  None   Cognitive State:  Able to Concentrate , Alert , Linear Thinking , Goal Oriented , Insightful    Mood/Affect:  Tearful , Interested , Calm    CSW Assessment: CSW met with MOB in her High Risk OB postpartum room/154 to introduce services, offer support, and complete assessment due to baby's admission to NICU at 25.6 weeks.  CSW spoke with MOB's bedside RN to inquire about the state of her health prior to  entering room.  MOB reports that she is feeling well physically, despite increased blood pressures, and that this is a good time to talk with her.  FOB/husband was asleep in chair and MOB's friend Marissa Moore, whom she describes as "more like a sister," was visiting with her.   MOB reports ability to visit with her daughter, even though she is having her own health issues, and that she read to her last night.  CSW asked how she felt when she was with her daughter and provided supportive brief counseling as MOB began to process her feelings related to baby's premature delivery and NICU admission.  MOB got choked up and tearful.  She acknowledges the stress and uncertainty of the situation and how this was not what she had planned.  CSW normalized and validated her feelings.  MOB added that she feels like the situation is still setting in.  CSW discussed the benefits of processing her emotions over the course of baby's hospitalization.  MOB agreed that it is not healthy to hold emotions in without addressing them.  CSW encouraged MOB to lean on her support people throughout this time and further explained CSW's role in NICU.  MOB seemed appreciative of CSW's concern for her emotional wellbeing.   MOB states she feels like all of her questions have been answered about the NICU at this point and feels comfortable.  CSW explained the availability for Family Conferences at any point  in baby's hospitalization.  CSW explained that information is difficult to process under stress and that it can often be helpful to step away from baby's bedside to receive and discuss information.  CSW asked MOB to call CSW any time she would like to arrange a Family Conference with the medial staff.   CSW explained baby's eligibility for Supplemental Security Income (SSI) and how to apply through the [facility] if interested.  CSW obtained MOB's signature on a Patient Request for Access form and provided MOB with a copy  of baby's admission summary noting gestational age and birth weight.  MOB was very appreciative of the information.  CSW offered to return to speak to FOB about anything we have discussed if he has questions.   CSW spoke with MOB about how she and FOB met and how she knows her friend/Marissa Moore to build rapport.  MOB states she and FOB went to high school together and have been dating since.  She states she is from [place], [place] and he is from [place].  They now live in [facility].  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  She states her husband is a Pharmacologist for the [employer].  She added that she received her masters degree in 2015 and that FOB will graduate in May with a dual masters.  She states she thought they had planned the pregnancy for baby to arrive when he was almost finished with his degree.  MOB's friend states she was initially friends with FOB and "knew Yolanda (MOB) was the one he (FOB) should marry."  Marissa Moore states she lives in [place], but will be visiting frequently to support parents.  MOB states her family and church family have also been here with them the past couple days.   CSW provided education on common emotions often experienced after delivery as well as information regarding perinatal mood disorders.  CSW acknowledged that the NICU experience can be stressful and anxiety provoking at times.  CSW discussed the importance of talking with CSW and or her doctor if she has concerns about her emotions/mental health at any time.  MOB stated understanding and states no current concerns.   CSW provided contact information and asked that MOB call any time.  MOB stated appreciation for the visit and support offered.  CSW Plan/Description:  Engineer, mining , Information/Referral to [employer] , Psychosocial Support and Ongoing Assessment of Needs     Alphonzo Cruise, [place] 11/15/2015, 11:42 AM

## 2015-11-16 MED ORDER — NIFEDIPINE ER OSMOTIC RELEASE 90 MG PO TB24
90.0000 mg | ORAL_TABLET | Freq: Every day | ORAL | Status: DC
  Administered 2015-11-16 – 2015-11-17 (×2): 90 mg via ORAL
  Filled 2015-11-16 (×2): qty 1
  Filled 2015-11-16: qty 3

## 2015-11-16 NOTE — Progress Notes (Signed)
Subjective: Postpartum Day 3: Cesarean Delivery due to Preeclampsia with severe features and HEELP syndrome  Patient reports tolerating PO, + flatus and no problems voiding.  Pt denies headache visual disturbances or ruq pain... Pt required hydralazine  IV x 1 yesterday for severe range bp. Procardia was increased to 60 mg daily yesterday   Objective: Vital signs in last 24 hours: Temp:  [97.5 F (36.4 C)-98.8 F (37.1 C)] 98.2 F (36.8 C) (01/20 0752) Pulse Rate:  [76-115] 94 (01/20 0752) Resp:  [16-18] 17 (01/20 0752) BP: (137-181)/(90-110) 143/97 mmHg (01/20 0752) SpO2:  [96 %-99 %] 99 % (01/20 0752) Weight:  [156 lb 1.6 oz (70.806 kg)] 156 lb 1.6 oz (70.806 kg) (01/19 1251)  Physical Exam:  General: alert and cooperative Lochia: appropriate Uterine Fundus: firm Incision: healing well DVT Evaluation: No evidence of DVT seen on physical exam.   Recent Labs  11/14/15 0630 11/14/15 1445  HGB 10.2* 10.9*  HCT 29.7* 32.5*    Assessment/Plan: Status post Cesarean section. Doing well postoperatively.  HELLP syndrome. Liver enzymes decreasing.  BP not well controlled. Will increase procardia XL to 90 mg today. Plan to monitor bp until tomorrow if well controlled. Plan to discharge tomorrow  .  Marissa Moore J. 11/16/2015, 8:25 AM

## 2015-11-16 NOTE — Lactation Note (Signed)
This note was copied from the chart of Marissa Mckensi Redinger. Lactation Consultation Note  Mom pleased she is pumping 5 mls every 3 hours.  Encouraged to call for assist/concerns prn.  Patient Name: Marissa Moore [id number] Date: 11/16/2015     Maternal Data    Feeding    LATCH Score/Interventions                      Lactation Tools Discussed/Used     Consult Status      Huston Foley 11/16/2015, 2:09 PM

## 2015-11-17 DIAGNOSIS — Z98891 History of uterine scar from previous surgery: Secondary | ICD-10-CM

## 2015-11-17 MED ORDER — IBUPROFEN 600 MG PO TABS: 600.0000 mg | ORAL_TABLET | Freq: Four times a day (QID) | ORAL | Status: DC | PRN

## 2015-11-17 MED ORDER — NIFEDIPINE ER OSMOTIC RELEASE 90 MG PO TB24: 90.0000 mg | ORAL_TABLET | Freq: Every day | ORAL | Status: AC

## 2015-11-17 MED ORDER — OXYCODONE-ACETAMINOPHEN 5-325 MG PO TABS: 1.0000 | ORAL_TABLET | ORAL | Status: AC | PRN

## 2015-11-17 NOTE — Lactation Note (Signed)
This note was copied from the chart of Girl Sophia Sperry. Lactation Consultation Note  Patient Name: Girl Tauna Macfarlane [id number] Date: 11/17/2015 Reason for consult: Follow-up assessment;Infant < 6lbs;NICU baby   Follow up with NICU mom of 4 day old infant prior to d/c. Mom has been pumping every 3 hours and is getting 5-7 cc per pumping. Enc mom to maintain pumping every 2-3 hours with a 4-5 hour stretch to sleep at night. Mom has received her Medela DEBP from insurance today. Enc her to continue to use hospital grade DEBP when she is here visiting infant either in pumping rooms in NICU or at bedside. Advised mom to practice STS with infant when infant able and to pump at bedside as able. Enc her to relax with pumping. Advised her that [facility] is here to support her throughout stay in hospital and to call with questions/concerns. Mom has Colostrum collections containers, bottles and labels to take home with her. She is aware not to store EBM in large volumes at this time due to infant size and to increase storage volumes over time. Enc mom to call with questions/concerns.   Maternal Data Does the patient have breastfeeding experience prior to this delivery?: No  Feeding    LATCH Score/Interventions                      Lactation Tools Discussed/Used WIC Program: No Pump Review: Milk Storage;Setup, frequency, and cleaning   Consult Status Consult Status: PRN Follow-up type: Call as needed    Ed Blalock 11/17/2015, 10:42 AM

## 2015-11-17 NOTE — Progress Notes (Signed)

## 2015-11-17 NOTE — Discharge Summary (Signed)
OB Discharge Summary     Patient Name: Marissa Moore DOB: 1985-12-14 MRN: [id number]  Date of admission: 11/11/2015 Delivering MD: Gerald Leitz   Date of discharge: 11/17/2015  Admitting diagnosis: 25 WKS, STOMACH PAIN, ACID REFLUX Intrauterine pregnancy: [redacted]w[redacted]d     Secondary diagnosis:  Active Problems:   HELLP (hemolytic anemia/elev liver enzymes/low platelets in pregnancy)   S/P cesarean section  Additional problems: preeclampsia with severe features / HELLP syndrome     Discharge diagnosis: Preterm Pregnancy Delivered, Preeclampsia (severe) and HELLP Syndrome          S/p cesarean section                                                                                       Post partum procedures:None  Augmentation: NA  Complications: None  Hospital course:  Sceduled C/S   30 y.o. yo G2P0101 at [redacted]w[redacted]d was admitted to the hospital 11/11/2015  With ruq pain. She was diagnosed with preeclampsia with severe features and HELLP syndrome. SHE underwent a scheduled cesarean section  11/13/2015  At 25 wks and 6 days EGA due to HELLP Syndrome. Patient delivered a Viable infant.11/13/2015  Details of operation can be found in separate operative note.  Pateint had an uncomplicated postpartum course.  She is ambulating, tolerating a regular diet, passing flatus, and urinating well. Patient is discharged home in stable condition on  11/17/2015          Physical exam  Filed Vitals:   11/16/15 2034 11/16/15 2155 11/17/15 0205 11/17/15 0543  BP: 137/98 141/88 135/87 129/93  Pulse: 112 110 104 91  Temp: 98.1 F (36.7 C) 98.9 F (37.2 C) 97.8 F (36.6 C) 98.5 F (36.9 C)  TempSrc: Oral Oral Oral Oral  Resp: Height:      Weight:    144 lb (65.318 kg)  SpO2:  100% 99% 99%   General: alert, cooperative and no distress Lochia: appropriate Uterine Fundus: firm Incision: Healing well with no significant drainage DVT Evaluation: No evidence of DVT seen on physical  exam. Labs: Lab Results  Component Value Date   WBC 14.0* 11/14/2015   HGB 10.9* 11/14/2015   HCT 32.5* 11/14/2015   MCV 92.3 11/14/2015   PLT 127* 11/14/2015   CMP Latest Ref Rng 11/14/2015  Glucose 65 - 99 mg/dL 244(W)  BUN 6 - 20 mg/dL 12  Creatinine 1.02 - 7.25 mg/dL 3.66  Sodium 440 - 347 mmol/L 137  Potassium 3.5 - 5.1 mmol/L 3.9  Chloride 101 - 111 mmol/L 103  CO2 22 - 32 mmol/L 25  Calcium 8.9 - 10.3 mg/dL 6.9(L)  Total Protein 6.5 - 8.1 g/dL 4.2(V)  Total Bilirubin 0.3 - 1.2 mg/dL 0.5  Alkaline Phos 38 - 126 U/L 147(H)  AST 15 - 41 U/L 51(H)  ALT 14 - 54 U/L 100(H)    Discharge instruction: per After Visit Summary and "Baby and Me Booklet".  After visit meds:    Medication List    STOP taking these medications        acetaminophen 325 MG tablet  Commonly known as:  TYLENOL  TAKE these medications        [facility] PRENATAL GUMMY PO  Take 2 each by mouth daily.     esomeprazole 20 MG capsule  Commonly known as:  NEXIUM  Take 20 mg by mouth daily at 12 noon.     hydroxychloroquine 200 MG tablet  Commonly known as:  PLAQUENIL  Take 400 mg by mouth daily.     ibuprofen 600 MG tablet  Commonly known as:  ADVIL,MOTRIN  Take 1 tablet (600 mg total) by mouth every 6 (six) hours as needed for mild pain.     lactobacillus acidophilus Tabs tablet  Take 1 tablet by mouth daily.     NIFEdipine 90 MG 24 hr tablet  Commonly known as:  PROCARDIA XL/ADALAT-CC  Take 1 tablet (90 mg total) by mouth daily.     oxyCODONE-acetaminophen 5-325 MG tablet  Commonly known as:  ROXICET  Take 1-2 tablets by mouth every 4 (four) hours as needed for severe pain.        Diet: low salt diet  Activity: Advance as tolerated. Pelvic rest for 6 weeks.   Outpatient follow up:1 week  for bp and incision check  Follow up Appt:No future appointments. Follow up Visit:No Follow-up on file.  Postpartum contraception: Not Discussed  Newborn Data: Live born female  Birth  Weight: 1 lb 7.3 oz (661 g) APGAR: 5, 9  Baby Feeding: Breast Disposition:NICU   11/17/2015 Jessee Avers., MD

## 2015-11-17 NOTE — Progress Notes (Signed)
Subjective: Postpartum Day 4: Cesarean Delivery Patient reports tolerating PO, + flatus and no problems voiding.    Objective: Vital signs in last 24 hours: Temp:  [97.8 F (36.6 C)-98.9 F (37.2 C)] 98.5 F (36.9 C) (01/21 0543) Pulse Rate:  [91-112] 91 (01/21 0543) Resp:  [16-18] 18 (01/21 0543) BP: (129-142)/(79-98) 129/93 mmHg (01/21 0543) SpO2:  [99 %-100 %] 99 % (01/21 0543) Weight:  [144 lb (65.318 kg)-148 lb 11.2 oz (67.45 kg)] 144 lb (65.318 kg) (01/21 0543)  Physical Exam:  General: alert and cooperative Lochia: appropriate Uterine Fundus: firm Incision: healing well DVT Evaluation: No evidence of DVT seen on physical exam.   Recent Labs  11/14/15 1445  HGB 10.9*  HCT 32.5*    Assessment/Plan: Status post Cesarean section. Doing well postoperatively.  Preeclampsia with severe features and HELLP syndrome.. BP well controlled on Procardia XL 90 mg daily . She will continue this at home  Discharge home today follow up in the office in 1 week for bp check .  Marissa Hizer J. 11/17/2015, 8:39 AM

## 2015-11-28 ENCOUNTER — Encounter (HOSPITAL_COMMUNITY): Payer: Self-pay | Admitting: *Deleted

## 2015-12-06 ENCOUNTER — Ambulatory Visit: Payer: Self-pay

## 2015-12-06 NOTE — Lactation Note (Signed)
This note was copied from a baby's chart. Lactation Consultation Note  Follow up visit made in NICU.  Baby is now 55 weeks old and 29.1 corrected gestational age.  Mom states she is not always able to pump every 3 hours but doing the best she can.  She is pumping 30-40 mls each pumping and feels this week has gone better.  Discussed importance of rest and maintaining a pumping schedule as close to every 3 hours as possible.  Praised for pumping efforts.  Encouraged to ask for assist or with concerns prn.  Patient Name: Marissa Moore [id number] Date: 12/06/2015     Maternal Data    Feeding Feeding Type: Breast Milk Length of feed: 60 min  LATCH Score/Interventions                      Lactation Tools Discussed/Used     Consult Status      Huston Foley 12/06/2015, 4:25 PM

## 2016-03-17 ENCOUNTER — Other Ambulatory Visit (HOSPITAL_COMMUNITY)
Admission: RE | Admit: 2016-03-17 | Discharge: 2016-03-17 | Disposition: A | Discharge status: Home / Self Care | Payer: BLUE CROSS/BLUE SHIELD | Source: Ambulatory Visit | Attending: Obstetrics and Gynecology | Admitting: Obstetrics and Gynecology

## 2016-03-17 ENCOUNTER — Other Ambulatory Visit: Payer: Self-pay | Admitting: Obstetrics and Gynecology

## 2016-03-17 DIAGNOSIS — Z1151 Encounter for screening for human papillomavirus (HPV): Secondary | ICD-10-CM | POA: Diagnosis not present

## 2016-03-17 DIAGNOSIS — Z01419 Encounter for gynecological examination (general) (routine) without abnormal findings: Secondary | ICD-10-CM | POA: Diagnosis present

## 2016-03-19 LAB — CYTOLOGY - PAP

## 2016-07-23 IMAGING — US US MFM OB COMP +14 WKS
1 series · 14 of 28 positions shown · non-contrast
Comparison: none

[Series 2: us mfm ob comp +14 wks · 98 acquisitions, 14 frames shown]
[im 4/98]
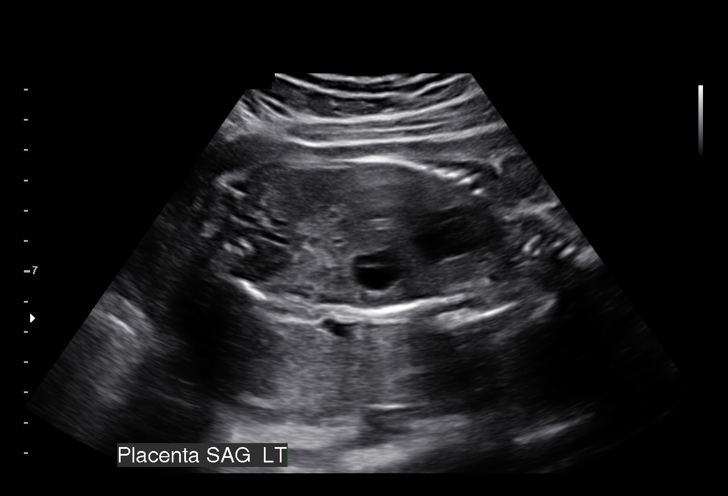
[im 11/98]
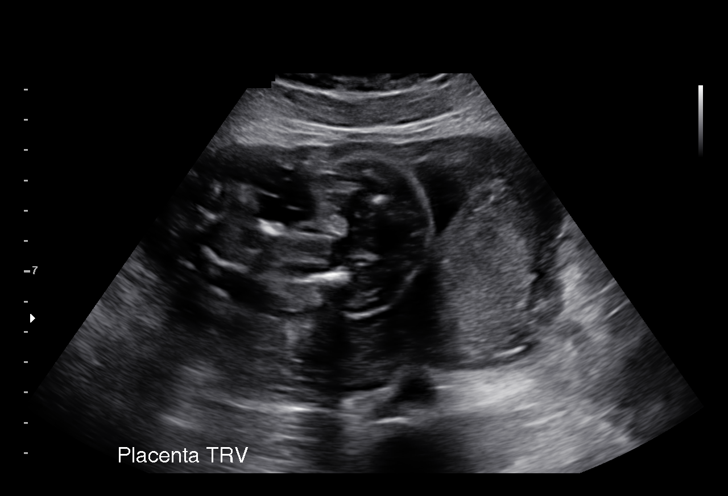
[im 18/98]
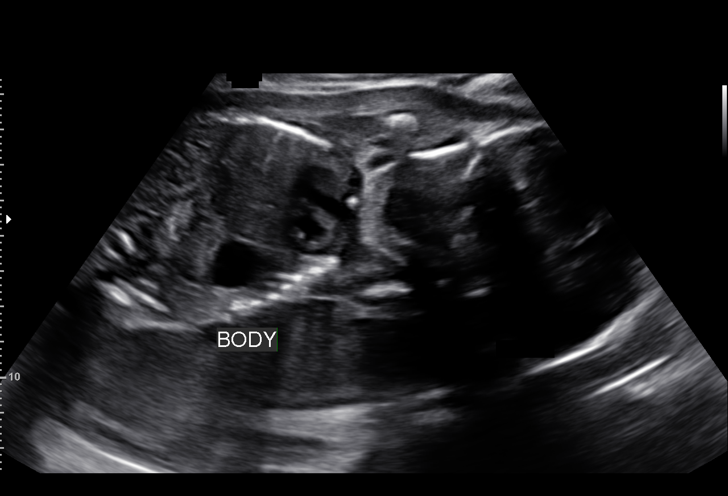
[im 26/98]
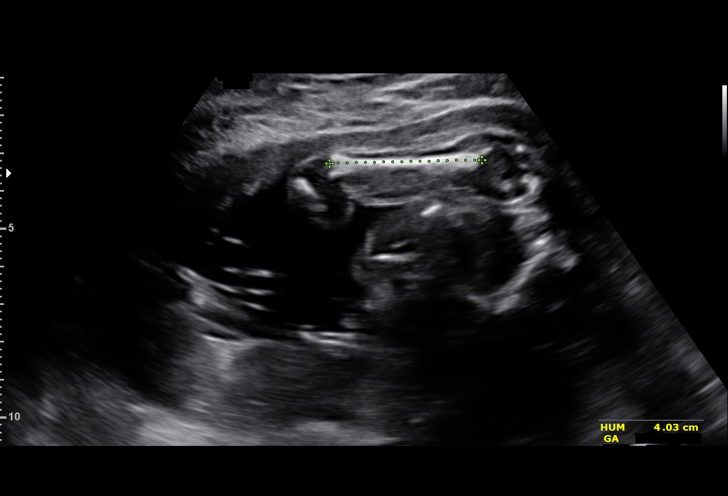
[im 33/98]
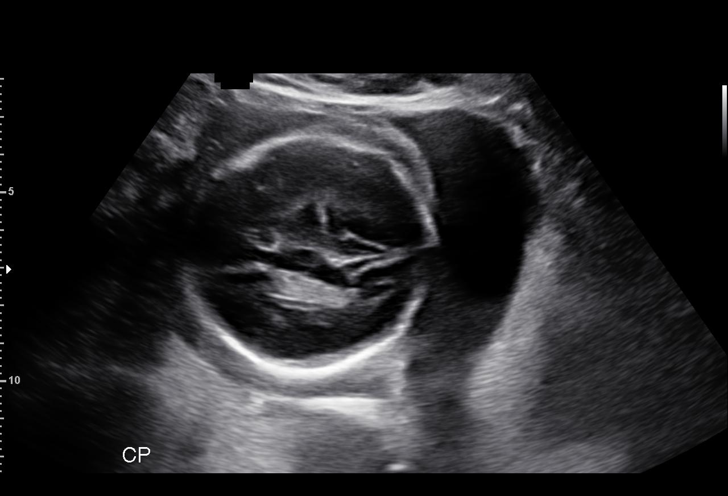
[im 40/98]
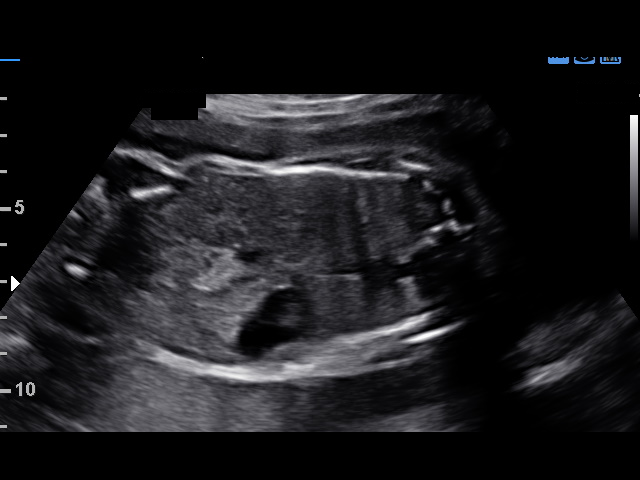
[im 47/98]
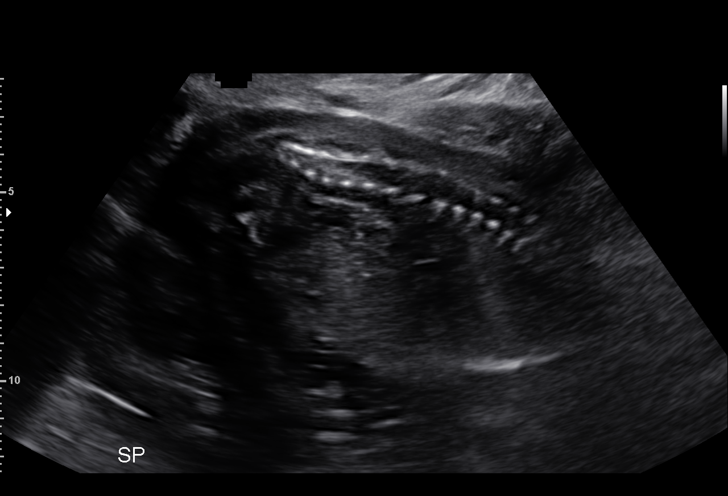
[im 54/98]
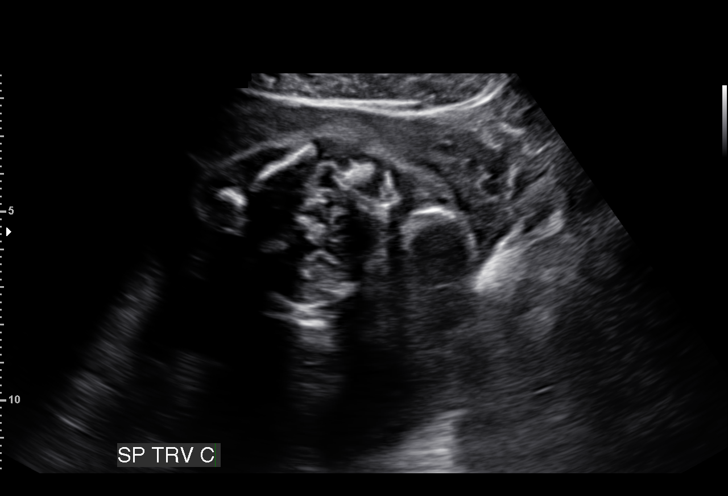
[im 62/98]
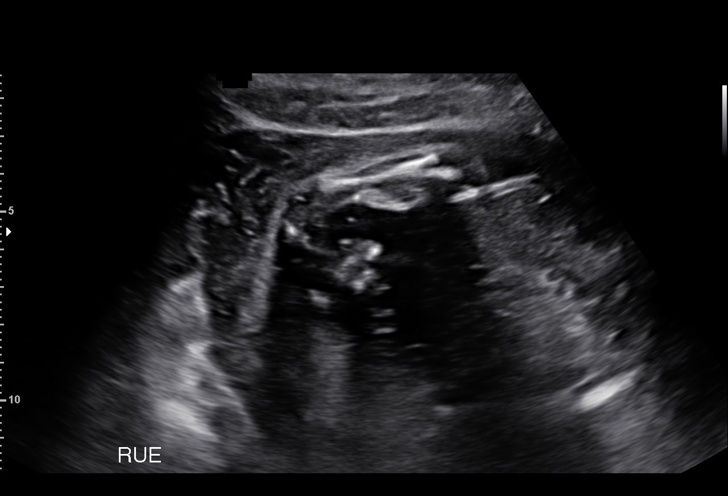
[im 69/98]
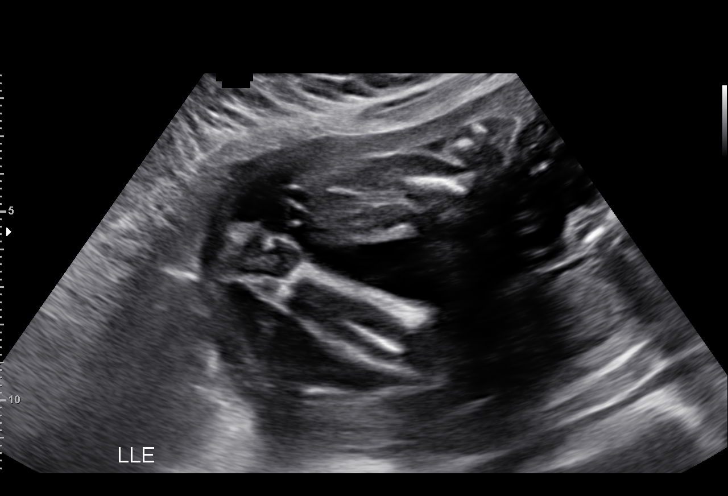
[im 76/98]
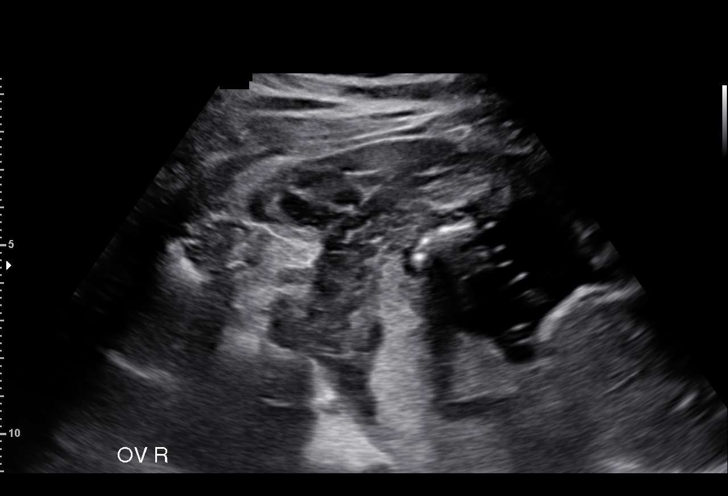
[im 83/98]
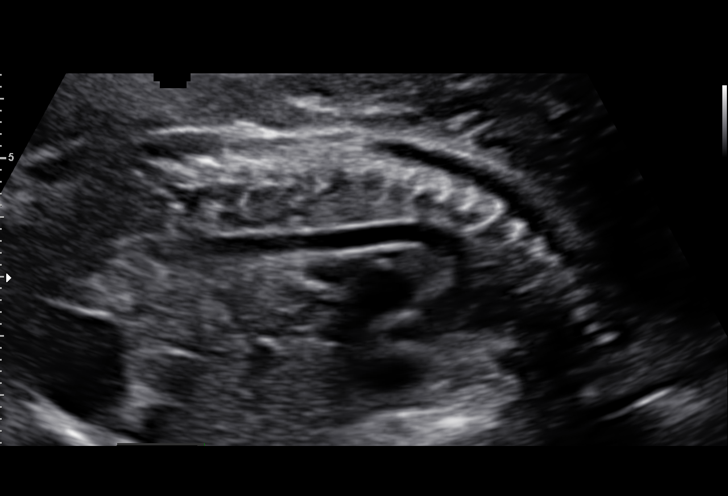
[im 90/98]
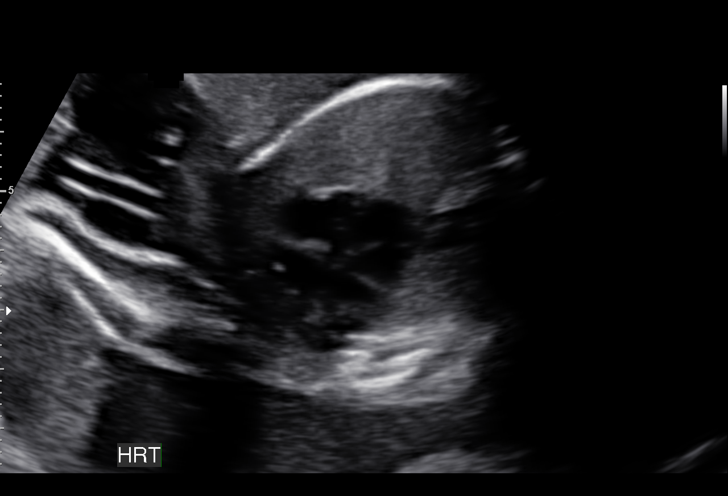
[im 98/98]
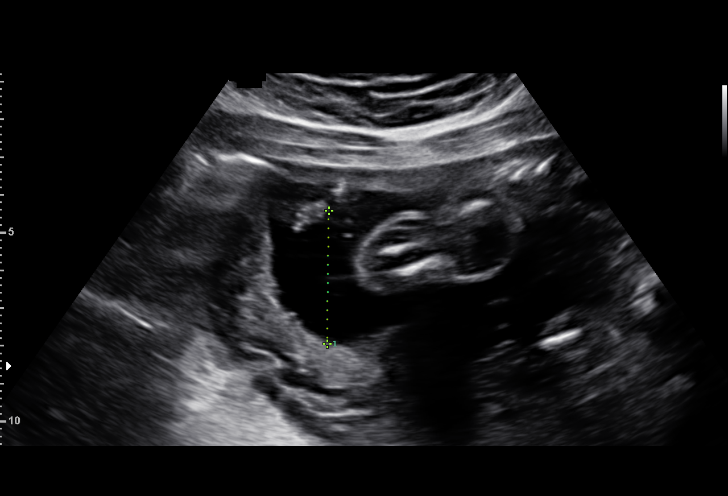

[14 of 28 positions shown; findings below may reference images not displayed]

pm)

Name:       KHALLY AURTHUR                      Visit  11/12/2015 [DATE]
Date:

Attending:         Marcel Brankica Armanda       Secondary          ANTE Nursing-
LEWIN                    Abis.:              Antenatal Unit

Indications

25 weeks gestation of pregnancy
HELLP syndrome, second trimester
Basic anatomic survey                           Z36
OB History

Gravidity:     1
Fetal Evaluation

Num Of Fetuses:      1
Fetal Heart          146
Rate(bpm):
Cardiac Activity:    Observed
Presentation:        Cephalic
Placenta:            Posterior LT, above cervical os
P. Cord Insertion:   Not well visualized

Amniotic Fluid
AFI FV:      Subjectively within normal limits
Larg Pckt:      3.5  cm
Biometry
BPD:      62.3  mm     G. Age:   25w 2d                  CI:        81.04   %    70 - 86
FL/HC:      20.2   %    18.6 -
HC:      218.5  mm     G. Age:   23w 6d       < 3   %    HC/AC:      1.11        1.04 -
AC:      196.4  mm     G. Age:   24w 2d          9  %    FL/BPD      70.8   %    71 - 87
:
FL:       44.1  mm     G. Age:   24w 4d          9  %    FL/AC:      22.5   %    20 - 24
HUM:        40  mm     G. Age:   24w 2d        11   %

Est.         692   gm    1 lb 8 oz      24   %
FW:
Gestational Age

U/S Today:     24w 4d                                         EDD:   02/28/16
Best:          25w 5d    Det. By:   Early Ultrasound          EDD:   02/20/16
Anatomy

Cranium:          Appears normal         Aortic Arch:       Appears normal
Fetal Cavum:      Appears normal         Ductal Arch:       Appears normal
Ventricles:       Appears normal         Diaphragm:         Appears normal
Choroid Plexus:   Appears normal         Stomach:           Appears normal,
left sided
Cerebellum:       Appears normal         Abdomen:           Appears normal
Posterior         Appears normal         Abdominal          Appears nml (cord
Fossa:                                   Wall:              insert, abd wall)
Nuchal Fold:      Not applicable (>20    Cord Vessels:      Appears normal (3
wks GA)                                   vessel cord)
Face:             Appears normal         Kidneys:           Appear normal
(orbits and profile)
Lips:             Appears normal         Bladder:           Appears normal
Heart:            Appears normal         Spine:             Appears normal
(4CH, axis, and
situs)
RVOT:             Appears normal         Upper              Appears normal
Extremities:
LVOT:             Appears normal         Lower              Appears normal
Extremities:

Other:   Heels visualized.
Cervix Uterus Adnexa

Cervix
Length:             3.4  cm.
Normal appearance by transabdominal scan.

Left Ovary
Within normal limits.

Right Ovary
Within normal limits.
Impression

Single IUP at 25w 5d
Preeclampsia with severe features / HELLP syndrome
Cephalic presentation
The estimated fetal weight today is at the 24th %tile (692 g)
Left, posterior placenta without previa
Normal amniotic fluid volume
Recommendations

See separate consult note

## 2018-06-14 ENCOUNTER — Emergency Department (HOSPITAL_COMMUNITY)
Admission: EM | Admit: 2018-06-14 | Discharge: 2018-06-15 | Disposition: A | Discharge status: Home / Self Care | Payer: BC Managed Care – PPO | Attending: Emergency Medicine | Admitting: Emergency Medicine

## 2018-06-14 ENCOUNTER — Other Ambulatory Visit: Payer: Self-pay

## 2018-06-14 DIAGNOSIS — Z79899 Other long term (current) drug therapy: Secondary | ICD-10-CM | POA: Insufficient documentation

## 2018-06-14 DIAGNOSIS — S301XXA Contusion of abdominal wall, initial encounter: Secondary | ICD-10-CM | POA: Diagnosis not present

## 2018-06-14 DIAGNOSIS — Y92410 Unspecified street and highway as the place of occurrence of the external cause: Secondary | ICD-10-CM | POA: Diagnosis not present

## 2018-06-14 DIAGNOSIS — Y999 Unspecified external cause status: Secondary | ICD-10-CM | POA: Insufficient documentation

## 2018-06-14 DIAGNOSIS — Y9389 Activity, other specified: Secondary | ICD-10-CM | POA: Diagnosis not present

## 2018-06-14 DIAGNOSIS — S20211A Contusion of right front wall of thorax, initial encounter: Secondary | ICD-10-CM | POA: Diagnosis not present

## 2018-06-14 DIAGNOSIS — S299XXA Unspecified injury of thorax, initial encounter: Secondary | ICD-10-CM | POA: Diagnosis present

## 2018-06-14 MED ORDER — SODIUM CHLORIDE 0.9 % IV BOLUS
1000.0000 mL | Freq: Once | INTRAVENOUS | Status: AC
  Administered 2018-06-15: 1000 mL via INTRAVENOUS
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
## 2018-06-15 ENCOUNTER — Emergency Department (HOSPITAL_COMMUNITY): Payer: BC Managed Care – PPO

## 2018-06-15 DIAGNOSIS — S20211A Contusion of right front wall of thorax, initial encounter: Secondary | ICD-10-CM | POA: Diagnosis not present

## 2018-06-15 LAB — CBC WITH DIFFERENTIAL/PLATELET
Abs Immature Granulocytes: 0 10*3/uL (ref 0.0–0.1)
Basophils Absolute: 0 10*3/uL (ref 0.0–0.1)
Basophils Relative: 0 %
EOS ABS: 0.1 10*3/uL (ref 0.0–0.7)
EOS PCT: 1 %
HEMATOCRIT: 40.4 % (ref 36.0–46.0)
Hemoglobin: 13.6 g/dL (ref 12.0–15.0)
Immature Granulocytes: 0 %
LYMPHS ABS: 1 10*3/uL (ref 0.7–4.0)
Lymphocytes Relative: 12 %
MCH: 30.4 pg (ref 26.0–34.0)
MCHC: 33.7 g/dL (ref 30.0–36.0)
MCV: 90.4 fL (ref 78.0–100.0)
MONOS PCT: 8 %
Monocytes Absolute: 0.6 10*3/uL (ref 0.1–1.0)
Neutro Abs: 6.4 10*3/uL (ref 1.7–7.7)
Neutrophils Relative %: 79 %
Platelets: 216 10*3/uL (ref 150–400)
RBC: 4.47 MIL/uL (ref 3.87–5.11)
RDW: 13 % (ref 11.5–15.5)
WBC: 8.1 10*3/uL (ref 4.0–10.5)

## 2018-06-15 LAB — COMPREHENSIVE METABOLIC PANEL
ALT: 15 U/L (ref 0–44)
ANION GAP: 9 (ref 5–15)
AST: 25 U/L (ref 15–41)
Albumin: 3.7 g/dL (ref 3.5–5.0)
Alkaline Phosphatase: 40 U/L (ref 38–126)
BILIRUBIN TOTAL: 0.7 mg/dL (ref 0.3–1.2)
BUN: 16 mg/dL (ref 6–20)
CO2: 23 mmol/L (ref 22–32)
Calcium: 9 mg/dL (ref 8.9–10.3)
Chloride: 106 mmol/L (ref 98–111)
Creatinine, Ser: 1.25 mg/dL — ABNORMAL HIGH (ref 0.44–1.00)
GFR, EST NON AFRICAN AMERICAN: 55 mL/min — AB (ref >60)
Glucose, Bld: 96 mg/dL (ref 70–99)
Potassium: 3.5 mmol/L (ref 3.5–5.1)
Sodium: 138 mmol/L (ref 135–145)
TOTAL PROTEIN: 6.9 g/dL (ref 6.5–8.1)

## 2018-06-15 LAB — URINALYSIS, ROUTINE W REFLEX MICROSCOPIC
BILIRUBIN URINE: NEGATIVE
Glucose, UA: NEGATIVE mg/dL
Hgb urine dipstick: NEGATIVE
KETONES UR: NEGATIVE mg/dL
Leukocytes, UA: NEGATIVE
Nitrite: NEGATIVE
PROTEIN: NEGATIVE mg/dL
Specific Gravity, Urine: 1.015 (ref 1.005–1.030)
pH: 6 (ref 5.0–8.0)

## 2018-06-15 LAB — I-STAT BETA HCG BLOOD, ED (MC, WL, AP ONLY)

## 2018-06-15 MED ORDER — IOPAMIDOL (ISOVUE-300) INJECTION 61%
100.0000 mL | Freq: Once | INTRAVENOUS | Status: AC | PRN
  Administered 2018-06-15: 100 mL via INTRAVENOUS

## 2018-06-15 MED ORDER — IBUPROFEN 800 MG PO TABS: 800.0000 mg | ORAL_TABLET | Freq: Four times a day (QID) | ORAL | 0 refills | Status: DC | PRN

## 2018-06-15 MED ORDER — IOPAMIDOL (ISOVUE-300) INJECTION 61%
INTRAVENOUS | Status: AC
  Filled 2018-06-15: qty 100

## 2018-06-15 MED ORDER — CYCLOBENZAPRINE HCL 10 MG PO TABS: 10.0000 mg | ORAL_TABLET | Freq: Three times a day (TID) | ORAL | 0 refills | Status: DC | PRN

## 2018-06-15 MED ORDER — TRAMADOL HCL 50 MG PO TABS: 50.0000 mg | ORAL_TABLET | Freq: Four times a day (QID) | ORAL | 0 refills | Status: AC | PRN
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
## 2018-06-15 NOTE — ED Notes (Signed)
Patient transported to CT 

## 2018-09-20 ENCOUNTER — Other Ambulatory Visit: Payer: Self-pay

## 2018-09-20 ENCOUNTER — Encounter (HOSPITAL_COMMUNITY): Payer: Self-pay | Admitting: Emergency Medicine

## 2018-09-20 ENCOUNTER — Emergency Department (HOSPITAL_COMMUNITY)
Admission: EM | Admit: 2018-09-20 | Discharge: 2018-09-20 | Disposition: A | Discharge status: Home / Self Care | Payer: BC Managed Care – PPO | Attending: Emergency Medicine | Admitting: Emergency Medicine

## 2018-09-20 DIAGNOSIS — Z79899 Other long term (current) drug therapy: Secondary | ICD-10-CM | POA: Insufficient documentation

## 2018-09-20 DIAGNOSIS — M62838 Other muscle spasm: Secondary | ICD-10-CM | POA: Insufficient documentation

## 2018-09-20 DIAGNOSIS — M542 Cervicalgia: Secondary | ICD-10-CM | POA: Diagnosis present

## 2018-09-20 MED ORDER — CYCLOBENZAPRINE HCL 5 MG PO TABS: 5.0000 mg | ORAL_TABLET | Freq: Three times a day (TID) | ORAL | 0 refills | Status: AC | PRN
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
## 2018-09-20 NOTE — Discharge Instructions (Addendum)
Do not drive while taking the muscle relaxer as it will make you sleepy. You may take Advil in addition to the muscle relaxer.
